# Patient Record
Sex: Female | Born: 1990
Health system: Southern US, Community
[De-identification: ages and names within clinical notes are randomized; demographics above are authoritative.]

## PROBLEM LIST (undated history)

## (undated) ENCOUNTER — Inpatient Hospital Stay (HOSPITAL_COMMUNITY): Payer: BC Managed Care – PPO

## (undated) ENCOUNTER — Inpatient Hospital Stay (HOSPITAL_COMMUNITY): Payer: Self-pay

## (undated) DIAGNOSIS — N63 Unspecified lump in unspecified breast: Secondary | ICD-10-CM

## (undated) DIAGNOSIS — N390 Urinary tract infection, site not specified: Secondary | ICD-10-CM

## (undated) DIAGNOSIS — F419 Anxiety disorder, unspecified: Secondary | ICD-10-CM

## (undated) DIAGNOSIS — O2303 Infections of kidney in pregnancy, third trimester: Secondary | ICD-10-CM

## (undated) DIAGNOSIS — Z9889 Other specified postprocedural states: Secondary | ICD-10-CM

## (undated) DIAGNOSIS — F32A Depression, unspecified: Secondary | ICD-10-CM

## (undated) DIAGNOSIS — F329 Major depressive disorder, single episode, unspecified: Secondary | ICD-10-CM

## (undated) DIAGNOSIS — O99019 Anemia complicating pregnancy, unspecified trimester: Secondary | ICD-10-CM

## (undated) DIAGNOSIS — N83209 Unspecified ovarian cyst, unspecified side: Secondary | ICD-10-CM

## (undated) DIAGNOSIS — R112 Nausea with vomiting, unspecified: Secondary | ICD-10-CM

## (undated) HISTORY — PX: PILONIDAL CYST EXCISION: SHX744

## (undated) HISTORY — DX: Anxiety disorder, unspecified: F41.9

## (undated) HISTORY — DX: Anemia complicating pregnancy, unspecified trimester: O99.019

## (undated) HISTORY — DX: Unspecified ovarian cyst, unspecified side: N83.209

## (undated) HISTORY — DX: Infections of kidney in pregnancy, third trimester: O23.03

## (undated) HISTORY — PX: WISDOM TOOTH EXTRACTION: SHX21

## (undated) HISTORY — DX: Unspecified lump in unspecified breast: N63.0

## (undated) HISTORY — DX: Depression, unspecified: F32.A

## (undated) HISTORY — PX: DILATION AND CURETTAGE OF UTERUS: SHX78

## (undated) HISTORY — PX: BREAST SURGERY: SHX581

## (undated) HISTORY — DX: Major depressive disorder, single episode, unspecified: F32.9

## (undated) HISTORY — PX: HERNIA REPAIR: SHX51

---
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
## 2009-06-03 ENCOUNTER — Emergency Department (HOSPITAL_COMMUNITY): Admission: EM | Admit: 2009-06-03 | Discharge: 2009-06-03 | Payer: Self-pay | Admitting: Emergency Medicine

## 2010-05-30 ENCOUNTER — Emergency Department (HOSPITAL_COMMUNITY)
Admission: EM | Admit: 2010-05-30 | Discharge: 2010-05-31 | Payer: Self-pay | Source: Home / Self Care | Admitting: Emergency Medicine

## 2010-06-25 ENCOUNTER — Emergency Department (HOSPITAL_COMMUNITY)
Admission: EM | Admit: 2010-06-25 | Discharge: 2010-06-25 | Payer: Self-pay | Source: Home / Self Care | Admitting: Emergency Medicine

## 2010-07-14 NOTE — L&D Delivery Note (Signed)
SVD VIABLE FEMALE APGAR 8/9 NUCHAL CORD X 1 NO EPIS  FIRST DEGREE MIDLINE TEAR WITH REPAIR EPIDURAL AND LOCAL PLACENTA INTACT WITH 3 VESSEL CORD

## 2010-09-23 LAB — DIFFERENTIAL
Basophils Relative: 1 % (ref 0–1)
Eosinophils Absolute: 0.1 10*3/uL (ref 0.0–0.7)
Eosinophils Relative: 1 % (ref 0–5)
Lymphs Abs: 1.4 10*3/uL (ref 0.7–4.0)
Monocytes Absolute: 0.5 10*3/uL (ref 0.1–1.0)
Monocytes Relative: 10 % (ref 3–12)
Neutrophils Relative %: 59 % (ref 43–77)

## 2010-09-23 LAB — URINALYSIS, ROUTINE W REFLEX MICROSCOPIC
Glucose, UA: NEGATIVE mg/dL
Hgb urine dipstick: NEGATIVE
Specific Gravity, Urine: 1.025 (ref 1.005–1.030)

## 2010-09-23 LAB — CBC
HCT: 36 % (ref 36.0–46.0)
Hemoglobin: 11.8 g/dL — ABNORMAL LOW (ref 12.0–15.0)
MCHC: 32.8 g/dL (ref 30.0–36.0)
RDW: 12.8 % (ref 11.5–15.5)
WBC: 4.7 10*3/uL (ref 4.0–10.5)

## 2010-09-23 LAB — COMPREHENSIVE METABOLIC PANEL
ALT: 20 U/L (ref 0–35)
AST: 14 U/L (ref 0–37)
Albumin: 4.2 g/dL (ref 3.5–5.2)
Alkaline Phosphatase: 52 U/L (ref 39–117)
Calcium: 9.3 mg/dL (ref 8.4–10.5)
GFR calc Af Amer: >60 mL/min (ref >60)
Glucose, Bld: 96 mg/dL (ref 70–99)
Potassium: 3.5 mEq/L (ref 3.5–5.1)
Sodium: 142 mEq/L (ref 135–145)
Total Protein: 7.1 g/dL (ref 6.0–8.3)

## 2010-09-23 LAB — URINE MICROSCOPIC-ADD ON

## 2010-09-23 LAB — URINE CULTURE: Culture  Setup Time: 201112131942

## 2010-09-24 LAB — COMPREHENSIVE METABOLIC PANEL
ALT: 13 U/L (ref 0–35)
AST: 15 U/L (ref 0–37)
CO2: 26 mEq/L (ref 19–32)
Chloride: 108 mEq/L (ref 96–112)
Creatinine, Ser: 0.87 mg/dL (ref 0.4–1.2)
GFR calc Af Amer: >60 mL/min (ref >60)
GFR calc non Af Amer: >60 mL/min (ref >60)
Glucose, Bld: 93 mg/dL (ref 70–99)
Sodium: 142 mEq/L (ref 135–145)
Total Bilirubin: 0.2 mg/dL — ABNORMAL LOW (ref 0.3–1.2)

## 2010-09-24 LAB — URINALYSIS, ROUTINE W REFLEX MICROSCOPIC
Glucose, UA: NEGATIVE mg/dL
Ketones, ur: 15 mg/dL — AB
Protein, ur: 30 mg/dL — AB
pH: 6 (ref 5.0–8.0)

## 2010-09-24 LAB — URINE MICROSCOPIC-ADD ON

## 2010-09-24 LAB — URINE CULTURE: Culture  Setup Time: 201111180130

## 2010-09-24 LAB — CBC
HCT: 35.4 % — ABNORMAL LOW (ref 36.0–46.0)
Hemoglobin: 11.7 g/dL — ABNORMAL LOW (ref 12.0–15.0)
MCH: 29.8 pg (ref 26.0–34.0)
MCHC: 33.1 g/dL (ref 30.0–36.0)
RBC: 3.93 MIL/uL (ref 3.87–5.11)

## 2010-09-24 LAB — DIFFERENTIAL
Basophils Absolute: 0 10*3/uL (ref 0.0–0.1)
Basophils Relative: 0 % (ref 0–1)
Eosinophils Absolute: 0.1 10*3/uL (ref 0.0–0.7)
Eosinophils Relative: 1 % (ref 0–5)
Neutrophils Relative %: 61 % (ref 43–77)

## 2010-09-24 LAB — WET PREP, GENITAL

## 2010-09-24 LAB — GC/CHLAMYDIA PROBE AMP, GENITAL: GC Probe Amp, Genital: NEGATIVE

## 2010-09-24 LAB — LIPASE, BLOOD: Lipase: 33 U/L (ref 11–59)

## 2010-10-16 LAB — URINE MICROSCOPIC-ADD ON

## 2010-10-16 LAB — URINALYSIS, ROUTINE W REFLEX MICROSCOPIC
Bilirubin Urine: NEGATIVE
Glucose, UA: NEGATIVE mg/dL
Ketones, ur: 15 mg/dL — AB
Nitrite: POSITIVE — AB
Protein, ur: >300 mg/dL — AB
Specific Gravity, Urine: 1.028 (ref 1.005–1.030)
Urobilinogen, UA: 1 mg/dL (ref 0.0–1.0)
pH: 6.5 (ref 5.0–8.0)

## 2010-10-16 LAB — URINE CULTURE: Colony Count: >=100000

## 2010-10-16 LAB — POCT PREGNANCY, URINE: Preg Test, Ur: NEGATIVE

## 2010-11-07 LAB — ABO/RH: RH Type: POSITIVE

## 2010-11-07 LAB — GC/CHLAMYDIA PROBE AMP, GENITAL
Chlamydia: NEGATIVE
Gonorrhea: NEGATIVE

## 2010-11-07 LAB — RUBELLA ANTIBODY, IGM: Rubella: IMMUNE

## 2010-11-15 DIAGNOSIS — O368190 Decreased fetal movements, unspecified trimester, not applicable or unspecified: Secondary | ICD-10-CM

## 2010-11-29 NOTE — H&P (Signed)
NAMEDEVANI, ODONNEL             ACCOUNT NO.:  [id number]   MEDICAL RECORD NO.:  [id number]          PATIENT TYPE:  INP   LOCATION:  6125                         FACILITY:  [facility]   PHYSICIAN:  Corwin Levins, M.D. LHCDATE OF BIRTH:  02-Feb-1991   DATE OF ADMISSION:  08/02/2004  DATE OF DISCHARGE:                                HISTORY & PHYSICAL   CHIEF COMPLAINT:  Here with two days worsening right-sided abdominal pain.   HISTORY OF PRESENT ILLNESS:  Mr. Tara Gallagher is a 20 year old white female here  for the above. She has had some discomfort on the right side, seemingly  right upper quadrant, but now looks like more towards right lower quadrant.  She thinks it is associated with nausea and anorexia. No vomiting. She has  had some constipation recently, but no other significant problems.   PAST MEDICAL HISTORY:   ILLNESS:  1.  Mild Tourette's.  2.  Status post pilonidal cyst.   ALLERGIES:  None.   CURRENT MEDICATIONS:  None.   SOCIAL HISTORY:  No tobacco or alcohol. Single. In the 8th grade.   FAMILY HISTORY:  Negative per patient and mother who is with her.   PHYSICAL EXAMINATION:  GENERAL: Ms. Tara Gallagher is a 20 year old white female.  VITAL SIGNS: Blood pressure 100/60, respirations 20, pulse 64, temperature  98, weight 118.  HEENT: Sclerae clear. TMs clear. Oropharynx benign.  NECK: Without lymphadenopathy, JVD, hepatomegaly.  CHEST: No rales or wheezing.  CARDIAC: Regular rate and rhythm. No murmur.  ABDOMEN: Soft without guarding or rebound, but at least moderate right-sided  pain, right upper and right lower quadrant.  EXTREMITIES: No edema.   IMPRESSION:  Abdominal pain suspicious for evolving appendicitis-like  discomfort in this young female. Will admit. Check routine labs, abdominal  and pelvis CT, cultures, give p.r.n. medications, Phenergan, and IV fluids.  Consider pediatric surgeon consult.       JWJ/MEDQ  D:  08/02/2004  T:  08/02/2004  Job:  [id number]

## 2010-11-29 NOTE — Discharge Summary (Signed)
NAMESHENE, MAXFIELD             ACCOUNT NO.:  [id number]   MEDICAL RECORD NO.:  [id number]          PATIENT TYPE:  INP   LOCATION:  6125                         FACILITY:  [facility]   PHYSICIAN:  Georgina Quint. Plotnikov, M.D. Romualdo Bolk OF BIRTH:  May 11, 1991   DATE OF ADMISSION:  08/02/2004  DATE OF DISCHARGE:  08/03/2004                                 DISCHARGE SUMMARY   DISCHARGE DIAGNOSES:  1.  Abdominal pain due to problem #2, resolved.  2.  Constipation/stool impaction, resolved.   TESTS:  CT scan negative for acute infectious process.   DISCHARGE MEDICATIONS:  1.  Fibercon tablets 2 to 6 daily to keep bowel movements regular.  2.  Crystallose 20 gm packets one-half - 1 p.r.n. constipation.   DIET:  High fiber.   SPECIAL INSTRUCTIONS:  Call if problems.   HISTORY:  The patient is a 20 year old female, who presented to Dr. Raphael Gibney  office with the complaint of abdominal pain.  She was admitted to [facility]  to rule out appendicitis.  Underwent a CAT scan, which by preliminary  reports, showed a large amount of stool in the rectum and colon and negative  for appendicitis.  She received a suppository, enema, and GoLYTELY.  Had two  large stools with good resolution of her symptoms.   DISCHARGE PHYSICAL EXAM:  GENERAL:  On the day of discharge, she is feeling  well.  VITAL SIGNS:  She is afebrile with a temperature of 36.8, heart rate 76,  respirations 18,  blood pressure 126/78, and SATs 98% on room air.  HEENT:  Moist mucosa.  LUNGS:  Clear.  HEART:  Regular S1, S2.  ABDOMEN:  Soft and nontender.  No organomegaly is felt.  NEUROLOGIC:  She is alert, oriented, and cooperative.   LABORATORY DATA:  CAT scan negative except for obstipation as above.  On  January 20th, blood count is normal with WBC of 6.0, hemoglobin 12.9 C-MET  is normal.     AVP/MEDQ  D:  08/03/2004  T:  08/03/2004  Job:  [id number]   cc:   Corwin Levins, M.D. [facility]

## 2010-11-29 NOTE — Op Note (Signed)
NAMEJANISSE, Tara Gallagher                       ACCOUNT NO.:  [id number]   MEDICAL RECORD NO.:  [id number]                   PATIENT TYPE:  AMB   LOCATION:  DSC                                  FACILITY:  [facility]   PHYSICIAN:  Jimmye Norman III, M.D.               DATE OF BIRTH:  10/27/1990   DATE OF PROCEDURE:  08/17/2003  DATE OF DISCHARGE:                                 OPERATIVE REPORT   PREOPERATIVE DIAGNOSIS:  Active pilonidal disease.   POSTOPERATIVE DIAGNOSIS:  Active pilonidal disease.   PROCEDURE:  Excision and closure of pilonidal diseased area.   SURGEON:  Jimmye Norman, M.D.   ASSISTANT:  None.   POSITION:  Jackknife prone.   ANESTHESIA:  General endotracheal.   ESTIMATED BLOOD LOSS:  Less than 20 mL.   COMPLICATIONS:  None.   CONDITION:  Good.   26 mL of local anesthetic was administered of 0.25% Marcaine with  epinephrine.   DISPOSITION:  To PACU and then to home.   SPECIMENS:  Excised pilonidal cystic diseased area.   FINDINGS:  The patient had a 2 to 3 mm opening with a deep pocket extending  approximately 4 cm toward the rectum, but not into the rectum itself.  Proximally, it did not extend far, but we went up to the most proximal site  of infection.   DESCRIPTION OF PROCEDURE:  The patient was taken to the operating room and  placed on the table in the supine position.  After an adequate endotracheal  anesthetic was administered, she was flipped into the jackknife prone  position and prepped and draped in the usual sterile fashion.   We used a probe to probe the sinus down toward the rectum where it extended  4 to 5 cm.  We used that in place as we marked the sites of our incision  using marking pen.  We then used a #10 blade to go through the skin into the  subcutaneous tissue down to the lowest point where the probe extended.  We  then used electrocautery to excise the subcu and tissue down to the fascia  of the coccyx and sacrum.  We obtained  hemostasis using electrocautery and  once we had adequate hemostasis, we removed completely the sinus and  pilonidal diseased area including areas at the furthest end of the probe.  There was no exposed bone or cartilage.  We then closed in three layers  after obtaining hemostasis and injecting with 0.25% Marcaine with  epinephrine.  The deepest layer was a 2-0 Vicryl layer and the deepest  subcutaneous tissue and the more superficial subcu tissue was reapproximated  using 3-0 Vicryl.  The skin was closed using interrupted vertical mattress  sutures of 3-0 nylon.  This reapproximated the skin very well and then a  sterile dressing was applied.  All needle, sponge, and instrument counts  correct at the conclusion of the case.  Kathrin Ruddy, M.D.    JW/MEDQ  D:  08/17/2003  T:  08/17/2003  Job:  [id number]

## 2011-03-07 ENCOUNTER — Inpatient Hospital Stay (HOSPITAL_COMMUNITY)
Admission: AD | Admit: 2011-03-07 | Discharge: 2011-03-07 | Disposition: A | Discharge status: Home / Self Care | Payer: BC Managed Care – PPO | Source: Ambulatory Visit | Attending: Obstetrics and Gynecology | Admitting: Obstetrics and Gynecology

## 2011-03-07 ENCOUNTER — Encounter (HOSPITAL_COMMUNITY): Payer: Self-pay | Admitting: *Deleted

## 2011-03-07 DIAGNOSIS — O368190 Decreased fetal movements, unspecified trimester, not applicable or unspecified: Secondary | ICD-10-CM | POA: Diagnosis present

## 2011-03-07 DIAGNOSIS — Z3689 Encounter for other specified antenatal screening: Secondary | ICD-10-CM

## 2011-03-07 DIAGNOSIS — Z3490 Encounter for supervision of normal pregnancy, unspecified, unspecified trimester: Secondary | ICD-10-CM

## 2011-03-07 NOTE — ED Provider Notes (Signed)
History     Chief Complaint  Patient presents with  . Decreased Fetal Movement   HPI No fetal movement x 2 days. Feeling fetal movement now. No bleeding, pain, leaking fluid, contractions. Uncomplicated prenatal course.   OB History    Grav Para Term Preterm Abortions TAB SAB Ect Mult Living   1               Past Medical History  Diagnosis Date  . No pertinent past medical history     Past Surgical History  Procedure Date  . No past surgeries     No family history on file.  History  Substance Use Topics  . Smoking status: Never Smoker   . Smokeless tobacco: Not on file  . Alcohol Use: No    Allergies: No Known Allergies  Prescriptions prior to admission  Medication Sig Dispense Refill  . acetaminophen (TYLENOL) 500 MG tablet Take 1,000 mg by mouth every 6 (six) hours as needed.        . prenatal vitamin w/FE, FA (PRENATAL 1 + 1) 27-1 MG TABS Take 1 tablet by mouth daily.          Review of Systems  Constitutional: Negative.   Respiratory: Negative.   Cardiovascular: Negative.   Gastrointestinal: Negative for nausea, vomiting, abdominal pain, diarrhea and constipation.  Genitourinary: Negative for dysuria, urgency, frequency, hematuria and flank pain.       Negative for vaginal bleeding, cramping/contractions  Musculoskeletal: Negative.   Neurological: Negative.   Psychiatric/Behavioral: Negative.    Physical Exam   Blood pressure 126/67, pulse 93, temperature 97.6 F (36.4 C), temperature source Oral, resp. rate 20, height 5\' 3"  (1.6 m), weight 73.029 kg (161 lb), last menstrual period 09/11/2010, SpO2 99.00%.  Physical Exam  Constitutional: She is oriented to person, place, and time. She appears well-developed and well-nourished. No distress.  Cardiovascular: Normal rate.   Respiratory: Effort normal.  GI: Soft. There is no tenderness.  Musculoskeletal: Normal range of motion.  Neurological: She is alert and oriented to person, place, and time.    Skin: Skin is warm and dry.  Psychiatric: She has a normal mood and affect.   EFM: Baseline:140s Variability:mod Accels:present Decels:variable x 1  Toco:quiet  MAU Course  Procedures [place] ok to d/c home with reactive tracing  Assessment and Plan  20 y.o. G1P0 at [redacted]w[redacted]d Decreased fetal movement, reactive NST D/C home, Rev'd kick counts, f/u as scheduled  Roquel Burgin 03/07/2011, 9:32 PM

## 2011-03-07 NOTE — Progress Notes (Signed)
Pt states she has not felt the baby move in x2 days.

## 2011-04-04 ENCOUNTER — Inpatient Hospital Stay (HOSPITAL_COMMUNITY)
Admission: AD | Admit: 2011-04-04 | Discharge: 2011-04-04 | Disposition: A | Discharge status: Home / Self Care | Payer: BC Managed Care – PPO | Source: Ambulatory Visit | Attending: Obstetrics and Gynecology | Admitting: Obstetrics and Gynecology

## 2011-04-04 ENCOUNTER — Encounter (HOSPITAL_COMMUNITY): Payer: Self-pay | Admitting: *Deleted

## 2011-04-04 DIAGNOSIS — O368190 Decreased fetal movements, unspecified trimester, not applicable or unspecified: Secondary | ICD-10-CM | POA: Insufficient documentation

## 2011-04-04 DIAGNOSIS — O288 Other abnormal findings on antenatal screening of mother: Secondary | ICD-10-CM | POA: Diagnosis present

## 2011-04-04 NOTE — ED Provider Notes (Signed)
History     Chief Complaint  Patient presents with  . Decreased Fetal Movement   HPI No fetal movement all day. Few contractions, no bleeding or LOF. Baby moving now.   OB History    Grav Para Term Preterm Abortions TAB SAB Ect Mult Living   1               Past Medical History  Diagnosis Date  . No pertinent past medical history     Past Surgical History  Procedure Date  . Wisdom tooth extraction     No family history on file.  History  Substance Use Topics  . Smoking status: Never Smoker   . Smokeless tobacco: Not on file  . Alcohol Use: No    Allergies: No Known Allergies  Prescriptions prior to admission  Medication Sig Dispense Refill  . acetaminophen (TYLENOL) 500 MG tablet Take 1,000 mg by mouth every 6 (six) hours as needed. For pain      . IRON PO Take 1 tablet by mouth daily.        . prenatal vitamin w/FE, FA (PRENATAL 1 + 1) 27-1 MG TABS Take 1 tablet by mouth daily.          Review of Systems  Constitutional: Negative.   Respiratory: Negative.   Cardiovascular: Negative.   Gastrointestinal: Negative for nausea, vomiting, abdominal pain, diarrhea and constipation.  Genitourinary: Negative for dysuria, urgency, frequency, hematuria and flank pain.       Negative for vaginal bleeding  Musculoskeletal: Negative.   Neurological: Negative.   Psychiatric/Behavioral: Negative.    Physical Exam   Blood pressure 135/65, pulse 85, temperature 98.6 F (37 C), temperature source Oral, resp. rate 18, height 5\' 2"  (1.575 m), weight 82.192 kg (181 lb 3.2 oz), last menstrual period 09/11/2010.  Physical Exam  Constitutional: She is oriented to person, place, and time. She appears well-developed and well-nourished. No distress.  Cardiovascular: Normal rate.   Respiratory: Effort normal.  GI: Soft. There is no tenderness.  Musculoskeletal: Normal range of motion.  Neurological: She is alert and oriented to person, place, and time.  Skin: Skin is warm and  dry.  Psychiatric: She has a normal mood and affect.   EFM: Baseline:140 Variability:mod Accels:present Decels:absent  Toco:quiet  MAU Course  Procedures   Assessment and Plan  20 y.o. G1P0 [redacted]w[redacted]d with decreased fetal movement Reactive NST D/C home with precautions  FRAZIER,NATALIE 04/04/2011, 1:53 AM

## 2011-04-04 NOTE — Progress Notes (Signed)
Pt G1 at 29.3wks, no fetal movement today, having contractions since 0045.

## 2011-04-04 NOTE — Progress Notes (Signed)
Pt presents to mau for decreased fetal movement.  efm and toco placed. Assessing.

## 2011-04-04 NOTE — Progress Notes (Signed)
N. Frazier, CNM at bedside.  Assessment done and poc discussed with pt.  

## 2011-05-20 ENCOUNTER — Inpatient Hospital Stay (HOSPITAL_COMMUNITY)
Admission: AD | Admit: 2011-05-20 | Discharge: 2011-05-20 | Disposition: A | Discharge status: Home / Self Care | Payer: BC Managed Care – PPO | Source: Ambulatory Visit | Attending: Obstetrics and Gynecology | Admitting: Obstetrics and Gynecology

## 2011-05-20 ENCOUNTER — Encounter (HOSPITAL_COMMUNITY): Payer: Self-pay

## 2011-05-20 DIAGNOSIS — F419 Anxiety disorder, unspecified: Secondary | ICD-10-CM

## 2011-05-20 DIAGNOSIS — O368190 Decreased fetal movements, unspecified trimester, not applicable or unspecified: Secondary | ICD-10-CM

## 2011-05-20 DIAGNOSIS — F411 Generalized anxiety disorder: Secondary | ICD-10-CM

## 2011-05-20 DIAGNOSIS — O4700 False labor before 37 completed weeks of gestation, unspecified trimester: Secondary | ICD-10-CM | POA: Insufficient documentation

## 2011-05-20 DIAGNOSIS — O479 False labor, unspecified: Secondary | ICD-10-CM

## 2011-05-20 NOTE — Progress Notes (Signed)
Pt states, " I've been having contractions since 5:45 and they are strong than usual. The baby isn't moving as much as today, maybe twice in an hour. I just hadn't felt good this week. My chest has been hurting since yesterday and it is worse today with a lot of pressure and I feel like I can't breathe."

## 2011-05-20 NOTE — ED Provider Notes (Signed)
History     CSN: [id number] Arrival date & time: 05/20/2011  8:02 PM   None     Chief Complaint  Patient presents with  . Contractions  . Decreased Fetal Movement  . Chest Pain     HPI Tara Gallagher is a 20 y.o. female @ [redacted]w[redacted]d gestation who presents to MAU for decreased fetal movement for the past 5 hours. Feeling pressure in lower abdomen, nausea.   Past Medical History  Diagnosis Date  . No pertinent past medical history     Past Surgical History  Procedure Date  . Wisdom tooth extraction   . Pilonidal cyst excision 2002    No family history on file.  History  Substance Use Topics  . Smoking status: Never Smoker   . Smokeless tobacco: Not on file  . Alcohol Use: No    OB History    Grav Para Term Preterm Abortions TAB SAB Ect Mult Living   1 0 0 0 0 0 0 0 0 0       Review of Systems  Constitutional: Negative for fever, chills, diaphoresis and fatigue.  HENT: Positive for ear pain and congestion. Negative for sore throat, facial swelling, neck pain, neck stiffness, dental problem and sinus pressure.   Eyes: Negative for photophobia, pain and discharge.  Respiratory: Positive for cough. Negative for chest tightness and wheezing.   Cardiovascular: Negative.   Gastrointestinal: Positive for nausea and abdominal pain. Negative for vomiting, diarrhea, constipation and abdominal distention.  Genitourinary: Positive for frequency, vaginal discharge and pelvic pain. Negative for dysuria, flank pain and difficulty urinating.  Musculoskeletal: Positive for back pain. Negative for myalgias and gait problem.  Skin: Negative for color change and rash.  Neurological: Positive for dizziness. Negative for speech difficulty, weakness, numbness and headaches.  Psychiatric/Behavioral: Negative for confusion and agitation.    Allergies  Review of patient's allergies indicates no known allergies.  Home Medications  No current outpatient prescriptions on file.  BP 123/62   Pulse 91  Temp(Src) 99.1 F (37.3 C) (Oral)  Resp 20  Ht 5' 2.5" (1.588 m)  Wt 197 lb 6 oz (89.529 kg)  BMI 35.53 kg/m2  SpO2 97%  LMP 09/11/2010  Physical Exam  Nursing note and vitals reviewed. Constitutional: She is oriented to person, place, and time. She appears well-developed and well-nourished. No distress.  HENT:  Head: Normocephalic.  Eyes: EOM are normal.  Neck: Neck supple.  Cardiovascular: Normal rate.   Pulmonary/Chest: Effort normal.  Abdominal: Soft. There is no tenderness.  Genitourinary:       Cervix 2 cm, 30% and ballotable. Base line FH 145, reactive tracing. Irregular contractions.  Musculoskeletal: Normal range of motion.  Neurological: She is alert and oriented to person, place, and time. No cranial nerve deficit.  Skin: Skin is warm and dry.  Psychiatric: Her behavior is normal. Judgment and thought content normal. Her mood appears anxious.   Assessment: False Labor   Anxiety   Decreased fetal movement per patient Plan:     Kick counts   Increase fluids   Follow up in the office  ED Course: consult with Dr. Arelia Sneddon request that we d/c patient home to follow up in the office.   Procedures    MDM          Kerrie Buffalo, NP 05/20/11 2207

## 2011-05-23 ENCOUNTER — Encounter (HOSPITAL_COMMUNITY): Payer: Self-pay | Admitting: *Deleted

## 2011-05-23 ENCOUNTER — Inpatient Hospital Stay (HOSPITAL_COMMUNITY)
Admission: AD | Admit: 2011-05-23 | Discharge: 2011-05-23 | Disposition: A | Discharge status: Home / Self Care | Payer: BC Managed Care – PPO | Source: Ambulatory Visit | Attending: Obstetrics and Gynecology | Admitting: Obstetrics and Gynecology

## 2011-05-23 DIAGNOSIS — O4700 False labor before 37 completed weeks of gestation, unspecified trimester: Secondary | ICD-10-CM | POA: Insufficient documentation

## 2011-05-23 NOTE — Progress Notes (Signed)
SAYS HURT BAD SINCE 0300-  WENT TO DR TODAY- VE-  2 CM    DENIES HSV AND MRSA.

## 2011-06-03 ENCOUNTER — Inpatient Hospital Stay (HOSPITAL_COMMUNITY)
Admission: AD | Admit: 2011-06-03 | Discharge: 2011-06-03 | Disposition: A | Discharge status: Home / Self Care | Payer: BC Managed Care – PPO | Source: Ambulatory Visit | Attending: Obstetrics and Gynecology | Admitting: Obstetrics and Gynecology

## 2011-06-03 ENCOUNTER — Encounter (HOSPITAL_COMMUNITY): Payer: Self-pay | Admitting: *Deleted

## 2011-06-03 DIAGNOSIS — O4700 False labor before 37 completed weeks of gestation, unspecified trimester: Secondary | ICD-10-CM | POA: Insufficient documentation

## 2011-06-03 NOTE — Progress Notes (Signed)
Notified Dr. Rana Snare SVE 3-4 /100/-1. Effacement change from office. OK to let pt walk in hall x1 hr and recheck cervix.

## 2011-06-03 NOTE — Progress Notes (Signed)
Notified of no change inSVE. OK to d/c home 

## 2011-06-03 NOTE — Progress Notes (Signed)
Pt states, " I've had a low backache all day, and on the way here I began feeling them in my low abdomen

## 2011-06-04 ENCOUNTER — Inpatient Hospital Stay (HOSPITAL_COMMUNITY)
Admission: AD | Admit: 2011-06-04 | Discharge: 2011-06-04 | Disposition: A | Discharge status: Home / Self Care | Payer: BC Managed Care – PPO | Source: Ambulatory Visit | Attending: Obstetrics and Gynecology | Admitting: Obstetrics and Gynecology

## 2011-06-04 ENCOUNTER — Encounter (HOSPITAL_COMMUNITY): Payer: Self-pay | Admitting: *Deleted

## 2011-06-04 DIAGNOSIS — O479 False labor, unspecified: Secondary | ICD-10-CM | POA: Insufficient documentation

## 2011-06-04 NOTE — Progress Notes (Signed)
Pt seen in MAU last night was 3-4 cm's, pt states uc's continue, are more intense, denies bleeding or LOF.

## 2011-06-04 NOTE — Progress Notes (Signed)
MD notified of unchanged SVE, reactive FHR, uc's q 7 minutes... See DC order.

## 2011-06-06 ENCOUNTER — Encounter (HOSPITAL_COMMUNITY): Payer: Self-pay

## 2011-06-06 ENCOUNTER — Inpatient Hospital Stay (HOSPITAL_COMMUNITY)
Admission: AD | Admit: 2011-06-06 | Discharge: 2011-06-09 | DRG: 373 | Disposition: A | Discharge status: Home / Self Care | Payer: BC Managed Care – PPO | Source: Ambulatory Visit | Attending: Obstetrics and Gynecology | Admitting: Obstetrics and Gynecology

## 2011-06-06 DIAGNOSIS — Z3490 Encounter for supervision of normal pregnancy, unspecified, unspecified trimester: Secondary | ICD-10-CM

## 2011-06-06 NOTE — Progress Notes (Signed)
Pt states, " i''ve had contractions since 7:45 pm and they are every 3 min. I've been 4cm since last Thursday ."

## 2011-06-06 NOTE — Progress Notes (Signed)
Patient c/o pain during contractions, about 5 minutes apart and moderate quality on palpation. SVE was very uncomfortable for patient, baby is high and cervix posterior. Cervix dilated to 3-4 cms, very soft with inner and outer os, 50% effaced, no bloody show. Called Dr. Arelia Sneddon, report provided.  Dr. Arelia Sneddon wants patient to stay for awhile and then reassess. Patient can walk if she would like. Candise Che, RN

## 2011-06-06 NOTE — Progress Notes (Deleted)
Patient is here with c/o sharp right lower to mid abdominal pain that started about 2 hours ago. She also c/o intermittent shooting pain in her left groin that radiates to her legs. She denies any vaginal bleeding, discharge or lof., she reports good fetal movement.

## 2011-06-07 ENCOUNTER — Encounter (HOSPITAL_COMMUNITY): Payer: Self-pay | Admitting: Anesthesiology

## 2011-06-07 ENCOUNTER — Encounter (HOSPITAL_COMMUNITY): Payer: Self-pay | Admitting: *Deleted

## 2011-06-07 ENCOUNTER — Inpatient Hospital Stay (HOSPITAL_COMMUNITY): Payer: BC Managed Care – PPO | Admitting: Anesthesiology

## 2011-06-07 LAB — CBC
HCT: 31.8 % — ABNORMAL LOW (ref 36.0–46.0)
MCV: 91.9 fL (ref 78.0–100.0)
RBC: 3.46 MIL/uL — ABNORMAL LOW (ref 3.87–5.11)
WBC: 13.7 10*3/uL — ABNORMAL HIGH (ref 4.0–10.5)

## 2011-06-07 MED ORDER — OXYTOCIN 20 UNITS IN LACTATED RINGERS INFUSION - SIMPLE
1.0000 m[IU]/min | INTRAVENOUS | Status: DC
  Administered 2011-06-07: 2 m[IU]/min via INTRAVENOUS
  Administered 2011-06-07: 10 m[IU]/min via INTRAVENOUS
  Filled 2011-06-07: qty 1000

## 2011-06-07 MED ORDER — BISACODYL 10 MG RE SUPP: 10.0000 mg | Freq: Every day | RECTAL | Status: DC | PRN

## 2011-06-07 MED ORDER — OXYTOCIN 20 UNITS IN LACTATED RINGERS INFUSION - SIMPLE: 125.0000 mL/h | Freq: Once | INTRAVENOUS | Status: DC

## 2011-06-07 MED ORDER — FENTANYL 2.5 MCG/ML BUPIVACAINE 1/10 % EPIDURAL INFUSION (WH - ANES)
14.0000 mL/h | INTRAMUSCULAR | Status: DC
  Administered 2011-06-07: 14 mL/h via EPIDURAL
  Filled 2011-06-07 (×2): qty 60

## 2011-06-07 MED ORDER — EPHEDRINE 5 MG/ML INJ
10.0000 mg | INTRAVENOUS | Status: DC | PRN
  Filled 2011-06-07: qty 4

## 2011-06-07 MED ORDER — DIPHENHYDRAMINE HCL 50 MG/ML IJ SOLN: 12.5000 mg | INTRAMUSCULAR | Status: DC | PRN

## 2011-06-07 MED ORDER — LACTATED RINGERS IV SOLN
INTRAVENOUS | Status: DC
  Administered 2011-06-07: 10:00:00 via INTRAVENOUS

## 2011-06-07 MED ORDER — SENNOSIDES-DOCUSATE SODIUM 8.6-50 MG PO TABS: 2.0000 | ORAL_TABLET | Freq: Every day | ORAL | Status: DC

## 2011-06-07 MED ORDER — DIBUCAINE 1 % RE OINT: 1.0000 "application " | TOPICAL_OINTMENT | RECTAL | Status: DC | PRN

## 2011-06-07 MED ORDER — LACTATED RINGERS IV SOLN: 500.0000 mL | Freq: Once | INTRAVENOUS | Status: DC

## 2011-06-07 MED ORDER — FLEET ENEMA 7-19 GM/118ML RE ENEM: 1.0000 | ENEMA | RECTAL | Status: DC | PRN

## 2011-06-07 MED ORDER — TERBUTALINE SULFATE 1 MG/ML IJ SOLN: 0.2500 mg | Freq: Once | INTRAMUSCULAR | Status: DC | PRN

## 2011-06-07 MED ORDER — BUTORPHANOL TARTRATE 2 MG/ML IJ SOLN
1.0000 mg | INTRAMUSCULAR | Status: DC | PRN
  Administered 2011-06-07: 1 mg via INTRAVENOUS
  Filled 2011-06-07 (×2): qty 1

## 2011-06-07 MED ORDER — PRENATAL PLUS 27-1 MG PO TABS
1.0000 | ORAL_TABLET | Freq: Every day | ORAL | Status: DC
  Administered 2011-06-08 – 2011-06-09 (×2): 1 via ORAL
  Filled 2011-06-07 (×2): qty 1

## 2011-06-07 MED ORDER — EPHEDRINE 5 MG/ML INJ: 10.0000 mg | INTRAVENOUS | Status: DC | PRN

## 2011-06-07 MED ORDER — IBUPROFEN 600 MG PO TABS
600.0000 mg | ORAL_TABLET | Freq: Four times a day (QID) | ORAL | Status: DC
  Administered 2011-06-07 – 2011-06-09 (×7): 600 mg via ORAL
  Filled 2011-06-07 (×7): qty 1

## 2011-06-07 MED ORDER — ONDANSETRON HCL 4 MG PO TABS: 4.0000 mg | ORAL_TABLET | ORAL | Status: DC | PRN

## 2011-06-07 MED ORDER — LACTATED RINGERS IV SOLN
500.0000 mL | INTRAVENOUS | Status: DC | PRN
  Administered 2011-06-07 (×2): 1000 mL via INTRAVENOUS

## 2011-06-07 MED ORDER — TETANUS-DIPHTH-ACELL PERTUSSIS 5-2.5-18.5 LF-MCG/0.5 IM SUSP: 0.5000 mL | Freq: Once | INTRAMUSCULAR | Status: DC

## 2011-06-07 MED ORDER — OXYCODONE-ACETAMINOPHEN 5-325 MG PO TABS: 2.0000 | ORAL_TABLET | ORAL | Status: DC | PRN

## 2011-06-07 MED ORDER — PHENYLEPHRINE 40 MCG/ML (10ML) SYRINGE FOR IV PUSH (FOR BLOOD PRESSURE SUPPORT): 80.0000 ug | PREFILLED_SYRINGE | INTRAVENOUS | Status: DC | PRN

## 2011-06-07 MED ORDER — LANOLIN HYDROUS EX OINT: TOPICAL_OINTMENT | CUTANEOUS | Status: DC | PRN

## 2011-06-07 MED ORDER — WITCH HAZEL-GLYCERIN EX PADS: 1.0000 "application " | MEDICATED_PAD | CUTANEOUS | Status: DC | PRN

## 2011-06-07 MED ORDER — OXYTOCIN BOLUS FROM INFUSION
500.0000 mL | Freq: Once | INTRAVENOUS | Status: AC
  Administered 2011-06-07: 500 mL via INTRAVENOUS
  Filled 2011-06-07: qty 500

## 2011-06-07 MED ORDER — CITRIC ACID-SODIUM CITRATE 334-500 MG/5ML PO SOLN: 30.0000 mL | ORAL | Status: DC | PRN

## 2011-06-07 MED ORDER — BENZOCAINE-MENTHOL 20-0.5 % EX AERO: 1.0000 "application " | INHALATION_SPRAY | CUTANEOUS | Status: DC | PRN

## 2011-06-07 MED ORDER — FENTANYL 2.5 MCG/ML BUPIVACAINE 1/10 % EPIDURAL INFUSION (WH - ANES)
INTRAMUSCULAR | Status: DC | PRN
  Administered 2011-06-07: 14 mL/h via EPIDURAL

## 2011-06-07 MED ORDER — BUTORPHANOL TARTRATE 2 MG/ML IJ SOLN
1.0000 mg | Freq: Once | INTRAMUSCULAR | Status: AC
  Administered 2011-06-07: 1 mg via INTRAVENOUS

## 2011-06-07 MED ORDER — ONDANSETRON HCL 4 MG/2ML IJ SOLN: 4.0000 mg | INTRAMUSCULAR | Status: DC | PRN

## 2011-06-07 MED ORDER — ONDANSETRON HCL 4 MG/2ML IJ SOLN
4.0000 mg | Freq: Four times a day (QID) | INTRAMUSCULAR | Status: DC | PRN
  Administered 2011-06-07: 4 mg via INTRAVENOUS
  Filled 2011-06-07: qty 2

## 2011-06-07 MED ORDER — ACETAMINOPHEN 325 MG PO TABS: 650.0000 mg | ORAL_TABLET | ORAL | Status: DC | PRN

## 2011-06-07 MED ORDER — PHENYLEPHRINE 40 MCG/ML (10ML) SYRINGE FOR IV PUSH (FOR BLOOD PRESSURE SUPPORT)
80.0000 ug | PREFILLED_SYRINGE | INTRAVENOUS | Status: DC | PRN
  Filled 2011-06-07: qty 5

## 2011-06-07 MED ORDER — SIMETHICONE 80 MG PO CHEW: 80.0000 mg | CHEWABLE_TABLET | ORAL | Status: DC | PRN

## 2011-06-07 MED ORDER — IBUPROFEN 600 MG PO TABS: 600.0000 mg | ORAL_TABLET | Freq: Four times a day (QID) | ORAL | Status: DC | PRN

## 2011-06-07 MED ORDER — ZOLPIDEM TARTRATE 5 MG PO TABS: 5.0000 mg | ORAL_TABLET | Freq: Every evening | ORAL | Status: DC | PRN

## 2011-06-07 MED ORDER — LIDOCAINE HCL (PF) 1 % IJ SOLN
30.0000 mL | INTRAMUSCULAR | Status: DC | PRN
  Filled 2011-06-07 (×2): qty 30

## 2011-06-07 MED ORDER — DIPHENHYDRAMINE HCL 25 MG PO CAPS: 25.0000 mg | ORAL_CAPSULE | Freq: Four times a day (QID) | ORAL | Status: DC | PRN

## 2011-06-07 MED ORDER — FLEET ENEMA 7-19 GM/118ML RE ENEM
1.0000 | ENEMA | RECTAL | Status: DC | PRN
Start: 2011-06-07 — End: 2011-06-09

## 2011-06-07 MED ORDER — BENZOCAINE-MENTHOL 20-0.5 % EX AERO
INHALATION_SPRAY | CUTANEOUS | Status: AC
  Filled 2011-06-07: qty 56

## 2011-06-07 MED ORDER — OXYCODONE-ACETAMINOPHEN 5-325 MG PO TABS
1.0000 | ORAL_TABLET | ORAL | Status: DC | PRN
  Administered 2011-06-08: 1 via ORAL
  Filled 2011-06-07: qty 1

## 2011-06-07 MED ORDER — SODIUM BICARBONATE 8.4 % IV SOLN
INTRAVENOUS | Status: DC | PRN
  Administered 2011-06-07: 5 mL via EPIDURAL

## 2011-06-07 NOTE — Progress Notes (Signed)
68- spontaneous del of viable female infant. apgars 9/9.

## 2011-06-07 NOTE — Progress Notes (Signed)
1610- pt out to walk for 1 hour. Then dr. Arelia Sneddon will recheck her cervix.

## 2011-06-07 NOTE — Progress Notes (Signed)
Called Dr. Arelia Sneddon, orders to admit patient to [facility].

## 2011-06-07 NOTE — Anesthesia Preprocedure Evaluation (Signed)
Anesthesia Evaluation    Airway       Dental   Pulmonary          Cardiovascular     Neuro/Psych    GI/Hepatic   Endo/Other  Morbid obesity  Renal/GU      Musculoskeletal   Abdominal   Peds  Hematology   Anesthesia Other Findings   Reproductive/Obstetrics                           Anesthesia Physical Anesthesia Plan  ASA: III  Anesthesia Plan: Epidural   Post-op Pain Management:    Induction:   Airway Management Planned:   Additional Equipment:   Intra-op Plan:   Post-operative Plan:   Informed Consent: I have reviewed the patients History and Physical, chart, labs and discussed the procedure including the risks, benefits and alternatives for the proposed anesthesia with the patient or authorized representative who has indicated his/her understanding and acceptance.   Dental Advisory Given  Plan Discussed with:   Anesthesia Plan Comments: (Labs checked- platelets confirmed with RN in room. Fetal heart tracing, per RN, reported to be stable enough for sitting procedure. Discussed epidural, and patient consents to the procedure:  included risk of possible headache,backache, failed block, allergic reaction, and nerve injury. This patient was asked if she had any questions or concerns before the procedure started.)        Anesthesia Quick Evaluation  

## 2011-06-07 NOTE — H&P (Signed)
Tara Gallagher is a 20 y.o. female presenting at 70.3 with SOL.  Noted cervical changes. AROM clear. GBS neg. Maternal Medical History:  Reason for admission: Reason for admission: contractions.  Contractions: Frequency: irregular.   Perceived severity is moderate.    Fetal activity: Perceived fetal activity is normal.    Prenatal Complications - Diabetes: none.    OB History    Grav Para Term Preterm Abortions TAB SAB Ect Mult Living   1 0 0 0 0 0 0 0 0 0      Past Medical History  Diagnosis Date  . No pertinent past medical history    Past Surgical History  Procedure Date  . Wisdom tooth extraction   . Pilonidal cyst excision 2002  . No past surgeries    Family History: family history is not on file. Social History:  reports that she has never smoked. She does not have any smokeless tobacco history on file. She reports that she does not drink alcohol or use illicit drugs.  ROS  Dilation: 4.5 Effacement (%): 60 Station: -1 Exam by:: dr. Arelia Sneddon Blood pressure 115/68, pulse 87, temperature 98.1 F (36.7 C), temperature source Oral, resp. rate 18, height 5\' 2"  (1.575 m), weight 95.029 kg (209 lb 8 oz), last menstrual period 09/11/2010. Maternal Exam:  Uterine Assessment: Contraction strength is moderate.  Contraction frequency is irregular.   Abdomen: Patient reports no abdominal tenderness. Fundal height is c/w dates.   Estimated fetal weight is 7.   Fetal presentation: vertex  Introitus: Amniotic fluid character: clear.  Pelvis: adequate for delivery.   Cervix: Cervix evaluated by digital exam.     Physical Exam  Prenatal labs: ABO, Rh: A/Positive/-- (04/26 0000) Antibody: Negative (04/26 0000) Rubella: Immune (04/26 0000) RPR: Nonreactive (04/26 0000)  HBsAg: Negative (04/26 0000)  HIV: Non-reactive (04/26 0000)  GBS: Negative (10/31 0000)   Assessment/Plan: IUP at 38.3 with SOL.  Risk of pitocin discussed   Tara Gallagher S 06/07/2011, 9:00  AM

## 2011-06-07 NOTE — Anesthesia Procedure Notes (Signed)
Epidural Patient location during procedure: OB  Preanesthetic Checklist Completed: patient identified, site marked, surgical consent, pre-op evaluation, timeout performed, IV checked, risks and benefits discussed and monitors and equipment checked  Epidural Patient position: sitting Prep: site prepped and draped and DuraPrep Patient monitoring: continuous pulse ox and blood pressure Approach: midline Injection technique: LOR air  Needle:  Needle type: Tuohy  Needle gauge: 17 G Needle length: 9 cm Needle insertion depth: 6 cm Catheter type: closed end flexible Catheter size: 19 Gauge Catheter at skin depth: 12 cm Test dose: negative  Assessment Events: blood not aspirated, injection not painful, no injection resistance, negative IV test and no paresthesia  Additional Notes Dosing of Epidural:  1st dose, through needle ............................................. epi 1:200K + Xylocaine 40 mg  2nd dose, through catheter, after waiting 3 minutes.....epi 1:200K + Xylocaine 60 mg  3rd dose, through catheter after waiting 3 minutes .............................Marcaine   5mg   ( mg Marcaine are expressed as equivilent  cc's medication removed from the 0.1%Bupiv / fentanyl syringe from L&D pump)  ( 2% Xylo charted as a single dose in Epic Meds for ease of charting; actual dosing was fractionated as above, for saftey's sake)  As each dose occurred, patient was free of IV sx; and patient exhibited no evidence of SA injection.  Patient is more comfortable after epidural dosed. Please see RN's note for documentation of vital signs,and FHR which are stable.       

## 2011-06-07 NOTE — Progress Notes (Signed)
Discussed possibility of patient going home and returning with stronger contractions are 5 min apart. Patient and husband are reluctant to leave, they live 40 minutes away and are fearful. Patient is very uncomfortable with contraction pain and wants to stay. Will observe for a little longer and recheck for cervical change.

## 2011-06-07 NOTE — Progress Notes (Signed)
Initial admission charted by Franz Dell RN in error of other RN being logged into computer.

## 2011-06-08 LAB — CBC
Hemoglobin: 9.3 g/dL — ABNORMAL LOW (ref 12.0–15.0)
RBC: 3.06 MIL/uL — ABNORMAL LOW (ref 3.87–5.11)

## 2011-06-08 NOTE — Anesthesia Postprocedure Evaluation (Signed)
Anesthesia Post Note  Patient: Tara Gallagher  Procedure(s) Performed: * No procedures listed *  Anesthesia type: Epidural  Patient location: Mother/Baby  Post pain: Pain level controlled  Post assessment: Post-op Vital signs reviewed  Last Vitals:  Filed Vitals:   06/08/11 0525  BP: 128/77  Pulse: 91  Temp: 36.8 C  Resp: 18    Post vital signs: Reviewed  Level of consciousness: awake  Complications: No apparent anesthesia complications

## 2011-06-08 NOTE — Progress Notes (Signed)
Post Partum Day one Subjective: no complaints  Objective: Blood pressure 128/77, pulse 91, temperature 98.3 F (36.8 C), temperature source Oral, resp. rate 18, height 5\' 2"  (1.575 m), weight 95.029 kg (209 lb 8 oz), last menstrual period 09/11/2010, unknown if currently breastfeeding.  Physical Exam:  General: alert Lochia: appropriate Uterine Fundus: firm Incision: no inc DVT Evaluation: No evidence of DVT seen on physical exam.   Basename 06/08/11 0537 06/07/11 0340  HGB 9.3* 10.4*  HCT 27.8* 31.8*    Assessment/Plan: Plan for discharge tomorrow   LOS: 2 days   Azai Gaffin S 06/08/2011, 8:06 AM

## 2011-06-09 NOTE — Progress Notes (Signed)
Post Partum Day 2 Subjective: no complaints  Objective: Blood pressure 116/72, pulse 81, temperature 98.4 F (36.9 C), temperature source Oral, resp. rate 18, height 5\' 2"  (1.575 m), weight 95.029 kg (209 lb 8 oz), last menstrual period 09/11/2010, SpO2 95.00%, unknown if currently breastfeeding.  Physical Exam:  General: alert, cooperative and no distress Lochia: appropriate Uterine Fundus: firm Incision:  DVT Evaluation: No evidence of DVT seen on physical exam.   Basename 06/08/11 0537 06/07/11 0340  HGB 9.3* 10.4*  HCT 27.8* 31.8*    Assessment/Plan: Discharge home   LOS: 3 days   Kandie Keiper C 06/09/2011, 8:34 AM

## 2011-06-13 NOTE — Discharge Summary (Signed)
Obstetric Discharge Summary Reason for Admission: rupture of membranes Prenatal Procedures: ultrasound Intrapartum Procedures: spontaneous vaginal delivery Postpartum Procedures: none Complications-Operative and Postpartum: none Hemoglobin  Date Value Range Status  06/08/2011 9.3* 12.0-15.0 (g/dL) Final     HCT  Date Value Range Status  06/08/2011 27.8* 36.0-46.0 (%) Final    Discharge Diagnoses: Term Pregnancy-delivered  Discharge Information: Date: 06/13/2011 Activity: pelvic rest Diet: routine Medications: PNV and Ibuprofen Condition: stable Instructions: refer to practice specific booklet Discharge to: home   Newborn Data: Live born female  Birth Weight: 7 lb 13 oz (3544 g) APGAR: 8, 9  Home with mother.  Amol Domanski G 06/13/2011, 9:03 AM

## 2013-04-12 LAB — OB RESULTS CONSOLE RUBELLA ANTIBODY, IGM: RUBELLA: IMMUNE

## 2013-04-12 LAB — OB RESULTS CONSOLE HIV ANTIBODY (ROUTINE TESTING): HIV: NONREACTIVE

## 2013-04-12 LAB — OB RESULTS CONSOLE HEPATITIS B SURFACE ANTIGEN: Hepatitis B Surface Ag: NEGATIVE

## 2013-04-12 LAB — OB RESULTS CONSOLE GC/CHLAMYDIA
Chlamydia: NEGATIVE
Gonorrhea: NEGATIVE

## 2013-04-12 LAB — OB RESULTS CONSOLE ABO/RH: RH TYPE: POSITIVE

## 2013-04-12 LAB — OB RESULTS CONSOLE ANTIBODY SCREEN: Antibody Screen: NEGATIVE

## 2013-04-12 LAB — OB RESULTS CONSOLE RPR: RPR: NONREACTIVE

## 2013-07-14 NOTE — L&D Delivery Note (Signed)
Delivery Note At 1:40 AM a healthy female was delivered via Vaginal, Spontaneous Delivery (Presentation: ; Occiput Anterior).  APGAR 8,9 ; weight pending .   Placenta status: Intact, Spontaneous Manual removal. Adherent to top of fundus, exploration after removal, with no fragments felt Cord: 3 vessels with the following complications: Short.    Anesthesia: Epidural  Episiotomy: none Lacerations: none Suture Repair: NA Est. Blood Loss (mL): 300cc  Mom to postpartum.  Baby to Couplet care / Skin to Skin.  Oliver PilaKathy W Antolin Belsito 10/26/2013, 2:02 AM

## 2013-09-11 DIAGNOSIS — N390 Urinary tract infection, site not specified: Secondary | ICD-10-CM

## 2013-09-11 HISTORY — DX: Urinary tract infection, site not specified: N39.0

## 2013-09-28 LAB — OB RESULTS CONSOLE GBS: GBS: NEGATIVE

## 2013-09-29 ENCOUNTER — Inpatient Hospital Stay (HOSPITAL_COMMUNITY): Payer: 59

## 2013-09-29 ENCOUNTER — Encounter (HOSPITAL_COMMUNITY): Payer: Self-pay | Admitting: *Deleted

## 2013-09-29 ENCOUNTER — Inpatient Hospital Stay (HOSPITAL_COMMUNITY)
Admission: AD | Admit: 2013-09-29 | Discharge: 2013-10-01 | DRG: 781 | Disposition: A | Discharge status: Home / Self Care | Payer: 59 | Source: Ambulatory Visit | Attending: Obstetrics and Gynecology | Admitting: Obstetrics and Gynecology

## 2013-09-29 DIAGNOSIS — O2390 Unspecified genitourinary tract infection in pregnancy, unspecified trimester: Principal | ICD-10-CM | POA: Diagnosis present

## 2013-09-29 DIAGNOSIS — N12 Tubulo-interstitial nephritis, not specified as acute or chronic: Secondary | ICD-10-CM | POA: Diagnosis present

## 2013-09-29 DIAGNOSIS — A6000 Herpesviral infection of urogenital system, unspecified: Secondary | ICD-10-CM | POA: Diagnosis present

## 2013-09-29 DIAGNOSIS — O98519 Other viral diseases complicating pregnancy, unspecified trimester: Secondary | ICD-10-CM | POA: Diagnosis present

## 2013-09-29 DIAGNOSIS — O4700 False labor before 37 completed weeks of gestation, unspecified trimester: Secondary | ICD-10-CM

## 2013-09-29 DIAGNOSIS — L0591 Pilonidal cyst without abscess: Secondary | ICD-10-CM | POA: Diagnosis present

## 2013-09-29 DIAGNOSIS — O2303 Infections of kidney in pregnancy, third trimester: Secondary | ICD-10-CM | POA: Diagnosis present

## 2013-09-29 LAB — DIFFERENTIAL
BLASTS: 0 %
Band Neutrophils: 1 % (ref 0–10)
Basophils Absolute: 0 10*3/uL (ref 0.0–0.1)
Basophils Relative: 0 % (ref 0–1)
Eosinophils Absolute: 0 10*3/uL (ref 0.0–0.7)
Eosinophils Relative: 0 % (ref 0–5)
LYMPHS ABS: 1.4 10*3/uL (ref 0.7–4.0)
LYMPHS PCT: 9 % — AB (ref 12–46)
MONO ABS: 1.1 10*3/uL — AB (ref 0.1–1.0)
MONOS PCT: 7 % (ref 3–12)
Metamyelocytes Relative: 1 %
Myelocytes: 0 %
Neutro Abs: 12.7 10*3/uL — ABNORMAL HIGH (ref 1.7–7.7)
Neutrophils Relative %: 82 % — ABNORMAL HIGH (ref 43–77)
Promyelocytes Absolute: 0 %
nRBC: 0 /100 WBC

## 2013-09-29 LAB — URINALYSIS, ROUTINE W REFLEX MICROSCOPIC
Bilirubin Urine: NEGATIVE
GLUCOSE, UA: NEGATIVE mg/dL
Nitrite: NEGATIVE
PROTEIN: NEGATIVE mg/dL
Specific Gravity, Urine: 1.01 (ref 1.005–1.030)
UROBILINOGEN UA: 0.2 mg/dL (ref 0.0–1.0)
pH: 6 (ref 5.0–8.0)

## 2013-09-29 LAB — CBC
HCT: 30.3 % — ABNORMAL LOW (ref 36.0–46.0)
HEMOGLOBIN: 9.9 g/dL — AB (ref 12.0–15.0)
MCH: 28.4 pg (ref 26.0–34.0)
MCHC: 32.7 g/dL (ref 30.0–36.0)
MCV: 87.1 fL (ref 78.0–100.0)
Platelets: 185 10*3/uL (ref 150–400)
RBC: 3.48 MIL/uL — ABNORMAL LOW (ref 3.87–5.11)
RDW: 13 % (ref 11.5–15.5)
WBC: 15.2 10*3/uL — ABNORMAL HIGH (ref 4.0–10.5)

## 2013-09-29 LAB — URINE MICROSCOPIC-ADD ON

## 2013-09-29 MED ORDER — DEXTROSE 5 % IV SOLN
1.0000 g | Freq: Two times a day (BID) | INTRAVENOUS | Status: DC
  Administered 2013-09-29 – 2013-09-30 (×3): 1 g via INTRAVENOUS
  Filled 2013-09-29 (×4): qty 10

## 2013-09-29 MED ORDER — TERBUTALINE SULFATE 1 MG/ML IJ SOLN: 0.2500 mg | Freq: Once | INTRAMUSCULAR | Status: DC

## 2013-09-29 MED ORDER — PRENATAL MULTIVITAMIN CH
1.0000 | ORAL_TABLET | Freq: Every day | ORAL | Status: DC
  Administered 2013-09-30: 1 via ORAL
  Filled 2013-09-29: qty 1

## 2013-09-29 MED ORDER — ZOLPIDEM TARTRATE 5 MG PO TABS: 5.0000 mg | ORAL_TABLET | Freq: Every evening | ORAL | Status: DC | PRN

## 2013-09-29 MED ORDER — DEXTROSE 5 % IV SOLN: 2.0000 g | Freq: Two times a day (BID) | INTRAVENOUS | Status: DC

## 2013-09-29 MED ORDER — DOCUSATE SODIUM 100 MG PO CAPS
100.0000 mg | ORAL_CAPSULE | Freq: Every day | ORAL | Status: DC
  Administered 2013-09-30: 100 mg via ORAL
  Filled 2013-09-29: qty 1

## 2013-09-29 MED ORDER — SODIUM CHLORIDE 0.9 % IV SOLN
INTRAVENOUS | Status: DC
  Administered 2013-09-29 – 2013-09-30 (×4): via INTRAVENOUS

## 2013-09-29 MED ORDER — LACTATED RINGERS IV SOLN: INTRAVENOUS | Status: DC

## 2013-09-29 MED ORDER — BUTORPHANOL TARTRATE 1 MG/ML IJ SOLN
1.0000 mg | Freq: Once | INTRAMUSCULAR | Status: AC
  Administered 2013-09-29: 1 mg via INTRAVENOUS
  Filled 2013-09-29: qty 1

## 2013-09-29 MED ORDER — ACETAMINOPHEN 325 MG PO TABS: 650.0000 mg | ORAL_TABLET | ORAL | Status: DC | PRN

## 2013-09-29 MED ORDER — CALCIUM CARBONATE ANTACID 500 MG PO CHEW: 2.0000 | CHEWABLE_TABLET | ORAL | Status: DC | PRN

## 2013-09-29 MED ORDER — OXYCODONE-ACETAMINOPHEN 5-325 MG PO TABS: 1.0000 | ORAL_TABLET | Freq: Four times a day (QID) | ORAL | Status: DC | PRN

## 2013-09-29 MED ORDER — LACTATED RINGERS IV BOLUS (SEPSIS): 1000.0000 mL | Freq: Once | INTRAVENOUS | Status: DC

## 2013-09-29 MED ORDER — NIFEDIPINE 10 MG PO CAPS
10.0000 mg | ORAL_CAPSULE | ORAL | Status: DC | PRN
  Administered 2013-09-29 (×2): 10 mg via ORAL
  Filled 2013-09-29 (×2): qty 1

## 2013-09-29 MED ORDER — LACTATED RINGERS IV BOLUS (SEPSIS)
1000.0000 mL | Freq: Once | INTRAVENOUS | Status: AC
  Administered 2013-09-29: 1000 mL via INTRAVENOUS

## 2013-09-29 NOTE — MAU Note (Addendum)
PT SAYS SHE STARTED HURTING BAD AT  615PM.   NO VE IN OFFICE.  GBS-  NEG.    DENIES HV AND MRSA   NOTICED   FEVER  AT 530PM-  NO MEDS.

## 2013-09-29 NOTE — MAU Provider Note (Signed)
History     CSN: [id number]  Arrival date and time: 09/29/13 1906   First Provider Initiated Contact with Patient 09/29/13 1956      Chief Complaint  Patient presents with  . Contractions   HPI  Pt is a 23 yo G2P1001 at [redacted]w[redacted]d weeks IUP here with report of having contractions that started this evening around 1815.  Pt denies vaginal bleeding or leaking of fluid.  Also reports lower pelvic pressure, lower back pain, and fever.  Pt also reports that pilonidal cyst started to open two days ago and has been spotting blood.  She reports calling the surgeon who informed her to place gauze over cyst.    Past Medical History  Diagnosis Date  . No pertinent past medical history     Past Surgical History  Procedure Laterality Date  . Wisdom tooth extraction    . Pilonidal cyst excision  2002  . No past surgeries      History reviewed. No pertinent family history.  History  Substance Use Topics  . Smoking status: Never Smoker   . Smokeless tobacco: Not on file  . Alcohol Use: No    Allergies: No Known Allergies  Prescriptions prior to admission  Medication Sig Dispense Refill  . IRON PO Take 1 tablet by mouth daily.         Review of Systems  Constitutional: Positive for fever and chills.  Gastrointestinal: Positive for abdominal pain (contractions).  Genitourinary: Positive for dysuria and flank pain.       Pelvic pressure  All other systems reviewed and are negative.   Physical Exam   Blood pressure 106/81, pulse 124, temperature 99.5 F (37.5 C), temperature source Oral, resp. rate 20, height 5\' 3"  (1.6 m), weight 96.673 kg (213 lb 2 oz), SpO2 99.00%, unknown if currently breastfeeding.  Physical Exam  Constitutional: She is oriented to person, place, and time. She appears well-developed and well-nourished. No distress.  Appears uncomfortable  HENT:  Head: Normocephalic.  Neck: Normal range of motion. Neck supple.  Cardiovascular: Normal rate, regular rhythm  and normal heart sounds.   Respiratory: Effort normal and breath sounds normal.  GI: Soft. There is tenderness (lower pelvis). There is CVA tenderness (left).  Genitourinary: No bleeding around the vagina. Vaginal discharge (mucusy) found.  Musculoskeletal: Normal range of motion. She exhibits edema (trace pedal).       Back:  Neurological: She is alert and oriented to person, place, and time.  Skin: Skin is warm and dry.  Skin flushed    MAU Course  Procedures  2100 Called Dr. Ellyn Hack > reviewed HPI/exam (orders had been previously placed by Dr. Ellyn Hack for stadol, terbutaline and IV fluids) > continue IV fluids  2110 Report given to Dr. Ellyn Hack who assumes care of patient.   [facility]  Dorathy Kinsman, CNM called Dr. Ellyn Hack w/ results and cervical exam. Pt;s pain decreased from 9/10 to 6/10. Rn unable to give Terb due to tachycardia. Procardia ordered in it's place. Pt has a Development worker, international aid in [place] who drained her Pilonidal cyst last time. Called her today to resume care.   Dilation: 1.5 internally Effacement (%): 50 Station: -3 Exam by:: [place], CNM  Results for orders placed during the hospital encounter of 09/29/13 (from the past 24 hour(s))  URINALYSIS, ROUTINE W REFLEX MICROSCOPIC     Status: Abnormal   Collection Time    09/29/13  7:03 PM      Result Value Ref Range   Color,  Urine YELLOW  YELLOW   APPearance CLEAR  CLEAR   Specific Gravity, Urine 1.010  1.005 - 1.030   pH 6.0  5.0 - 8.0   Glucose, UA NEGATIVE  NEGATIVE mg/dL   Hgb urine dipstick TRACE (*) NEGATIVE   Bilirubin Urine NEGATIVE  NEGATIVE   Ketones, ur >80 (*) NEGATIVE mg/dL   Protein, ur NEGATIVE  NEGATIVE mg/dL   Urobilinogen, UA 0.2  0.0 - 1.0 mg/dL   Nitrite NEGATIVE  NEGATIVE   Leukocytes, UA SMALL (*) NEGATIVE  URINE MICROSCOPIC-ADD ON     Status: Abnormal   Collection Time    09/29/13  7:03 PM      Result Value Ref Range   Squamous Epithelial / LPF MANY (*) RARE   WBC, UA 3-6   <3 WBC/hpf   Bacteria, UA FEW (*) RARE  CBC     Status: Abnormal   Collection Time    09/29/13  8:21 PM      Result Value Ref Range   WBC 15.2 (*) 4.0 - 10.5 K/uL   RBC 3.48 (*) 3.87 - 5.11 MIL/uL   Hemoglobin 9.9 (*) 12.0 - 15.0 g/dL   HCT 08.630.3 (*) 57.836.0 - 46.946.0 %   MCV 87.1  78.0 - 100.0 fL   MCH 28.4  26.0 - 34.0 pg   MCHC 32.7  30.0 - 36.0 g/dL   RDW 62.913.0  52.811.5 - 41.315.5 %   Platelets 185  150 - 400 K/uL  DIFFERENTIAL     Status: Abnormal   Collection Time    09/29/13  8:21 PM      Result Value Ref Range   Neutrophils Relative % 82 (*) 43 - 77 %   Lymphocytes Relative 9 (*) 12 - 46 %   Monocytes Relative 7  3 - 12 %   Eosinophils Relative 0  0 - 5 %   Basophils Relative 0  0 - 1 %   Band Neutrophils 1  0 - 10 %   Metamyelocytes Relative 1     Myelocytes 0     Promyelocytes Absolute 0     Blasts 0     nRBC 0  0 /100 WBC   Neutro Abs 12.7 (*) 1.7 - 7.7 K/uL   Lymphs Abs 1.4  0.7 - 4.0 K/uL   Monocytes Absolute 1.1 (*) 0.1 - 1.0 K/uL   Eosinophils Absolute 0.0  0.0 - 0.7 K/uL   Basophils Absolute 0.0  0.0 - 0.1 K/uL     Assessment and Plan   1. Pyelonephritis complicating pregnancy in third trimester, antepartum   2. Preterm uterine contractions, antepartum   3. Pilonidal cyst    Observe on antenatal per Dr. Ellyn HackBovard. Urine culture, GC/CT, GBS pending  [place] Ismael Karge, CNM 09/29/2013 10:49 PM

## 2013-09-29 NOTE — H&P (Addendum)
Tara Gallagher is a 23 y.o. female G2P1001 at 34+ with contractions, CVAT.  Also pilonidal cyst draining.  Low-grade fever at home 100.5, 99.5 in MAU.  Ill-appearing.  +FM, no LOF, no VB, ctx increasing in intensity and frequency.  Cervix stable over multiple hours. Pt admitted with likely pyelonephritis L CVAT.   Maternal Medical History:  Reason for admission: Contractions.   Fetal activity: Perceived fetal activity is normal.    Prenatal Complications - Diabetes: none.    OB History   Grav Para Term Preterm Abortions TAB SAB Ect Mult Living   2 1 1  0 0 0 0 0 0 1    G1 38wk SVD female G2 present  H/o abn pap, last WNL Serum HSV +   Past Medical History  Diagnosis Date  . No pertinent past medical history    Past Surgical History  Procedure Laterality Date  . Wisdom tooth extraction    . Pilonidal cyst excision  2002  . No past surgeries     Family History: family history is not on file. Noncontrib Social History:  reports that she has never smoked. She does not have any smokeless tobacco history on file. She reports that she does not drink alcohol or use illicit drugs.married, Nature conservation officerstocker at [employer]  Meds PNV All NKDA     Prenatal Transfer Tool  Maternal Diabetes: No Genetic Screening: Normal Maternal Ultrasounds/Referrals: Normal Fetal Ultrasounds or other Referrals:  None Maternal Substance Abuse:  No Significant Maternal Medications:  None Significant Maternal Lab Results:  None GBBS collected Other Comments:  [facility]  On Rocephin.    Review of Systems  Constitutional: Positive for fever.       Low-grade at home (100.5), 99.5 at MAU  HENT: Negative.   Eyes: Negative.   Respiratory: Negative.   Cardiovascular: Negative.   Gastrointestinal: Negative.   Genitourinary:       Pilonidal cyst draining  Musculoskeletal: Negative.   Skin: Negative.   Neurological: Negative.   Psychiatric/Behavioral: Negative.     Dilation: 1 Effacement  (%): 50 Station: -3 Exam by:: Tara Gallagher, CNM Blood pressure 106/81, pulse 124, temperature 98.6 F (37 C), temperature source Oral, resp. rate 20, height 5\' 3"  (1.6 m), weight 96.673 kg (213 lb 2 oz), SpO2 99.00%, unknown if currently breastfeeding. Maternal Exam:  Uterine Assessment: Contraction strength is moderate.  Contraction frequency is regular.   Abdomen: Fundal height is appropriateforgestation.   Fetal presentation: vertex  Introitus: Normal vulva. Normal vagina.  Pelvis: adequate for delivery.   Cervix: Cervix evaluated by digital exam.     Physical Exam  Constitutional: She is oriented to person, place, and time.  Ill-appearing  HENT:  Head: Normocephalic and atraumatic.  Cardiovascular: Normal rate and regular rhythm.   Respiratory: Effort normal and breath sounds normal. No respiratory distress. She has no wheezes.  GI: Soft. Bowel sounds are normal. She exhibits no distension. There is no tenderness.  Musculoskeletal: Normal range of motion.  Neurological: She is alert and oriented to person, place, and time.  Skin: Skin is warm and dry.  Psychiatric: She has a normal mood and affect. Her behavior is normal.   Pilonidal cyst draining, NT, no erythema  Prenatal labs: ABO, Rh:  A+ Antibody:  neg Rubella:  Immune RPR:   NR HBsAg:   neg HIV:   neg GBS:   collected  Hgb 11.9/Pap WNL/Ur Cx neg/GC neg/ Chl neg/First Tri Scr and AFP WNL/glucola 133/Plt 233K/CF neg  Dating by First Tri [place] cw LMP  Anat nl anat, cwd, female  Tdap 08/10/13  Assessment/Plan: 22yo G2P1001 At 34+ admitted with pyelonephritis  Treat with Rocephin 1g IV q 12 Procardia and IVF for ctx Close monitoring.   Pt has arranged OP f/u for pilonidal cyst, not painful  Collect GBBS  BOVARD,Dmitriy Gair 09/29/2013, 9:53 PM

## 2013-09-30 DIAGNOSIS — O2303 Infections of kidney in pregnancy, third trimester: Secondary | ICD-10-CM | POA: Diagnosis present

## 2013-09-30 LAB — TYPE AND SCREEN
ABO/RH(D): A POS
Antibody Screen: NEGATIVE

## 2013-09-30 LAB — ABO/RH: ABO/RH(D): A POS

## 2013-09-30 NOTE — Progress Notes (Signed)
Patient ID: Tara Gallagher, female   DOB: 20-Sep-1990, 23 y.o.   MRN: [id number]  G2P1001 at 34+3 admitted with pyelo  No c/o's.  +FM, no LOF, no VB, occ ctx.  Feeling better, back pain improved  AFVSS gen NAD Lungs CTAB Abd soft, FNT Min CVAT  FHTs 130-155, R toco Q2-593min, then occ  SVE 1cm per RN  22yo G2P1001 at 34+3 with pyelo and pilonidal cyst Continue Rocephin Pain control F/u for pilonidal cyst as OP TID NST

## 2013-10-01 LAB — CULTURE, OB URINE: Colony Count: 40000

## 2013-10-01 LAB — GC/CHLAMYDIA PROBE AMP
CT Probe RNA: NEGATIVE
GC PROBE AMP APTIMA: NEGATIVE

## 2013-10-01 MED ORDER — CEPHALEXIN 500 MG PO CAPS: 500.0000 mg | ORAL_CAPSULE | Freq: Three times a day (TID) | ORAL | Status: DC

## 2013-10-01 NOTE — Discharge Instructions (Signed)
Call for temp >100, increasing pain, severe nausea/vomiting

## 2013-10-01 NOTE — Accreditation Note (Signed)
HD #2, [redacted]W[redacted]d, ?pyelo, pilonidal cyst Feeling much better, no n/v. Pain ok Afeb entire hospital stay, VSS Back-no CVAT, dressing over pilonidal cyst for drainage I am not sure she has pyelonephritis, UA was contaminated and not impressive, no urine culture, no fever, did have elevated WBC but maybe this was from pilonidal cyst.  She feels better, will d/c home on Keflex.

## 2013-10-01 NOTE — Discharge Summary (Signed)
Physician Discharge Summary  Patient ID: Tara [name] MRN: [id number] DOB/AGE: 07-Jun-1991 22 y.o.  Admit date: 09/29/2013 Discharge date: 10/01/2013  Admission Diagnoses:  IUP at 34 weeks, back pain, pilonidal cyst  Discharge Diagnoses:  Same  Discharged Condition: good  Hospital Course: Pt admitted by Dr. Ellyn HackBovard for possible pyelonephritis and pilonidal cyst.  She was put on Rocephin, was never febrile, felt better by 2nd morning.  Pilonidal cyst drained, no CVAT at time of discharge.  UA was contaminated, no urine cx done.  Discharge Exam: Blood pressure 104/37, pulse 82, temperature 98.9 F (37.2 C), temperature source Oral, resp. rate 18, height 5\' 3"  (1.6 m), weight 96.673 kg (213 lb 2 oz), SpO2 99.00%, unknown if currently breastfeeding. General appearance: alert  Disposition: 01-Home or Self Care  Discharge Orders   Future Orders Complete By Expires   Discharge activity:  No Restrictions  As directed    Discharge diet:  No restrictions  As directed    No sexual activity restrictions  As directed    Notify physician for increase or change in vaginal discharge  As directed    Notify physician for leaking of fluid  As directed    Notify physician for uterine contractions.  These may be painless and feel like the uterus is tightening or the baby is  "balling up"  As directed    Notify physician for vaginal bleeding  As directed    PRETERM LABOR:  Includes any of the follwing symptoms that occur between 20 - [redacted] weeks gestation.  If these symptoms are not stopped, preterm labor can result in preterm delivery, placing your baby at risk  As directed        Medication List         cephALEXin 500 MG capsule  Commonly known as:  KEFLEX  Take 1 capsule (500 mg total) by mouth 3 (three) times daily.     prenatal multivitamin Tabs tablet  Take 1 tablet by mouth daily at 12 noon.           Follow-up Information   Please follow up. (as scheduled)        Signed: Rosette Bellavance D 10/01/2013, 9:16 AM

## 2013-10-02 LAB — CULTURE, BETA STREP (GROUP B ONLY)

## 2013-10-25 ENCOUNTER — Inpatient Hospital Stay (HOSPITAL_COMMUNITY)
Admission: AD | Admit: 2013-10-25 | Discharge: 2013-10-27 | DRG: 775 | Disposition: A | Discharge status: Home / Self Care | Payer: 59 | Source: Ambulatory Visit | Attending: Obstetrics and Gynecology | Admitting: Obstetrics and Gynecology

## 2013-10-25 ENCOUNTER — Telehealth (HOSPITAL_COMMUNITY): Payer: Self-pay | Admitting: *Deleted

## 2013-10-25 ENCOUNTER — Encounter (HOSPITAL_COMMUNITY): Payer: Self-pay | Admitting: *Deleted

## 2013-10-25 DIAGNOSIS — N289 Disorder of kidney and ureter, unspecified: Secondary | ICD-10-CM | POA: Diagnosis present

## 2013-10-25 DIAGNOSIS — O26839 Pregnancy related renal disease, unspecified trimester: Principal | ICD-10-CM | POA: Diagnosis present

## 2013-10-25 DIAGNOSIS — O99214 Obesity complicating childbirth: Secondary | ICD-10-CM

## 2013-10-25 DIAGNOSIS — O9902 Anemia complicating childbirth: Secondary | ICD-10-CM | POA: Diagnosis present

## 2013-10-25 DIAGNOSIS — E669 Obesity, unspecified: Secondary | ICD-10-CM | POA: Diagnosis present

## 2013-10-25 DIAGNOSIS — D649 Anemia, unspecified: Secondary | ICD-10-CM | POA: Diagnosis present

## 2013-10-25 DIAGNOSIS — Z6841 Body Mass Index (BMI) 40.0 and over, adult: Secondary | ICD-10-CM

## 2013-10-25 LAB — CBC
HCT: 33.5 % — ABNORMAL LOW (ref 36.0–46.0)
HEMOGLOBIN: 10.8 g/dL — AB (ref 12.0–15.0)
MCH: 27.7 pg (ref 26.0–34.0)
MCHC: 32.2 g/dL (ref 30.0–36.0)
MCV: 85.9 fL (ref 78.0–100.0)
PLATELETS: 206 10*3/uL (ref 150–400)
RBC: 3.9 MIL/uL (ref 3.87–5.11)
RDW: 14 % (ref 11.5–15.5)
WBC: 14 10*3/uL — AB (ref 4.0–10.5)

## 2013-10-25 MED ORDER — BUTORPHANOL TARTRATE 1 MG/ML IJ SOLN
1.0000 mg | INTRAMUSCULAR | Status: DC | PRN
Start: 2013-10-25 — End: 2013-10-26

## 2013-10-25 MED ORDER — LIDOCAINE HCL (PF) 1 % IJ SOLN
30.0000 mL | INTRAMUSCULAR | Status: DC | PRN
  Filled 2013-10-25: qty 30

## 2013-10-25 MED ORDER — OXYTOCIN BOLUS FROM INFUSION: 500.0000 mL | INTRAVENOUS | Status: DC

## 2013-10-25 MED ORDER — OXYCODONE-ACETAMINOPHEN 5-325 MG PO TABS
1.0000 | ORAL_TABLET | ORAL | Status: DC | PRN
Start: 2013-10-25 — End: 2013-10-26

## 2013-10-25 MED ORDER — EPHEDRINE 5 MG/ML INJ
10.0000 mg | INTRAVENOUS | Status: DC | PRN
  Filled 2013-10-25: qty 2
  Filled 2013-10-25: qty 4

## 2013-10-25 MED ORDER — LACTATED RINGERS IV SOLN: 500.0000 mL | INTRAVENOUS | Status: DC | PRN

## 2013-10-25 MED ORDER — ACETAMINOPHEN 325 MG PO TABS: 650.0000 mg | ORAL_TABLET | ORAL | Status: DC | PRN

## 2013-10-25 MED ORDER — LACTATED RINGERS IV SOLN
INTRAVENOUS | Status: DC
Start: 2013-10-25 — End: 2013-10-26

## 2013-10-25 MED ORDER — EPHEDRINE 5 MG/ML INJ
10.0000 mg | INTRAVENOUS | Status: DC | PRN
  Filled 2013-10-25: qty 2

## 2013-10-25 MED ORDER — FENTANYL 2.5 MCG/ML BUPIVACAINE 1/10 % EPIDURAL INFUSION (WH - ANES)
14.0000 mL/h | INTRAMUSCULAR | Status: DC | PRN
  Filled 2013-10-25: qty 125

## 2013-10-25 MED ORDER — DIPHENHYDRAMINE HCL 50 MG/ML IJ SOLN: 12.5000 mg | INTRAMUSCULAR | Status: DC | PRN

## 2013-10-25 MED ORDER — ONDANSETRON HCL 4 MG/2ML IJ SOLN: 4.0000 mg | Freq: Four times a day (QID) | INTRAMUSCULAR | Status: DC | PRN

## 2013-10-25 MED ORDER — LACTATED RINGERS IV SOLN: 500.0000 mL | Freq: Once | INTRAVENOUS | Status: DC

## 2013-10-25 MED ORDER — FLEET ENEMA 7-19 GM/118ML RE ENEM: 1.0000 | ENEMA | RECTAL | Status: DC | PRN

## 2013-10-25 MED ORDER — PHENYLEPHRINE 40 MCG/ML (10ML) SYRINGE FOR IV PUSH (FOR BLOOD PRESSURE SUPPORT)
80.0000 ug | PREFILLED_SYRINGE | INTRAVENOUS | Status: DC | PRN
  Filled 2013-10-25: qty 2

## 2013-10-25 MED ORDER — OXYTOCIN 40 UNITS IN LACTATED RINGERS INFUSION - SIMPLE MED
62.5000 mL/h | INTRAVENOUS | Status: DC
  Administered 2013-10-26: 62.5 mL/h via INTRAVENOUS
  Filled 2013-10-25: qty 1000

## 2013-10-25 MED ORDER — IBUPROFEN 600 MG PO TABS: 600.0000 mg | ORAL_TABLET | Freq: Four times a day (QID) | ORAL | Status: DC | PRN

## 2013-10-25 MED ORDER — CITRIC ACID-SODIUM CITRATE 334-500 MG/5ML PO SOLN: 30.0000 mL | ORAL | Status: DC | PRN

## 2013-10-25 MED ORDER — PHENYLEPHRINE 40 MCG/ML (10ML) SYRINGE FOR IV PUSH (FOR BLOOD PRESSURE SUPPORT)
80.0000 ug | PREFILLED_SYRINGE | INTRAVENOUS | Status: DC | PRN
  Filled 2013-10-25: qty 2
  Filled 2013-10-25: qty 10

## 2013-10-25 NOTE — Progress Notes (Signed)
Patient ID: Tara Gallagher, female   DOB: [birth year], 23 y.o.   MRN: [id number] Pt admitted to L&D, ctx q 3-7 mins Wanted to wait a bit on epidural, so ok with proceeding to AROM FHR reactive, a bit difficult to keep on monitor  60/4-5/-2  AROM clear  Monitor progress.

## 2013-10-25 NOTE — H&P (Signed)
Tara Gallagher is a 23 y.o. female G2P1001 at 1438 0/7 weeks (EDD 4/48/15 by LMP c/w 9 week US) presenting for contractions for several hours that have been intensifying.  Cervical exam last office visit 3-4 cm, and now changed to 4-5 cm.  Prenatal care has been relatively uncomplicated, was admitted at 34 weeks with draining pilonidal cyst and possible pyelonephritis, improved quickly with antibiotics.  Maternal Medical History:  Reason for admission: Contractions.   Contractions: Onset was 3-5 hours ago.   Frequency: regular.   Perceived severity is moderate.    Fetal activity: Perceived fetal activity is normal.    Prenatal Complications - Diabetes: none.    OB History   Grav Para Term Preterm Abortions TAB SAB Ect Mult Living   2 1 1  0 0 0 0 0 0 1    NSVD 2012 7#13oz female  Past Medical History  Diagnosis Date  . No pertinent past medical history    Past Surgical History  Procedure Laterality Date  . Wisdom tooth extraction    . Pilonidal cyst excision  2002  . No past surgeries     Family History: family history is not on file. Social History:  reports that she has never smoked. She has never used smokeless tobacco. She reports that she does not drink alcohol or use illicit drugs.   Prenatal Transfer Tool  Maternal Diabetes: No Genetic Screening: Normal Maternal Ultrasounds/Referrals: Normal Fetal Ultrasounds or other Referrals:  None Maternal Substance Abuse:  No Significant Maternal Medications:  None Significant Maternal Lab Results:  None Other Comments:  None  ROS  Dilation: 4.5 Effacement (%): 70 Station: -2 Exam by::  (L. Paschal, RN) Blood pressure 140/71, pulse 93, temperature 98.1 F (36.7 C), temperature source Oral, resp. rate 18, height 5\' 3"  (1.6 m), weight 101.152 kg (223 lb), SpO2 99.00%, unknown if currently breastfeeding. Maternal Exam:  Uterine Assessment: Contraction strength is moderate.  Contraction frequency is regular.   Abdomen:  Patient reports no abdominal tenderness. Fetal presentation: vertex  Introitus: Normal vulva. Normal vagina.  Pelvis: adequate for delivery.      Physical Exam  Constitutional: She is oriented to person, place, and time. She appears well-developed and well-nourished.  Cardiovascular: Normal rate and regular rhythm.   Respiratory: Effort normal and breath sounds normal.  GI: Soft.  Genitourinary: Vagina normal and uterus normal.  Neurological: She is alert and oriented to person, place, and time.  Psychiatric: She has a normal mood and affect. Her behavior is normal.    Prenatal labs: ABO, Rh: --/--/A POS (03/20 0554) Antibody: NEG (03/20 0554) Rubella: Immune (09/30 0000) RPR: Nonreactive (09/30 0000)  HBsAg: Negative (09/30 0000)  HIV: Non-reactive (09/30 0000)  GBS: Negative (03/18 0000)  First trimester screen and AFP negative One hour glucola 133   Assessment/Plan: Pt admitted with regular contractions and cervical change, desires epidural  Will allow epidural, then AROM.  Oliver PilaKathy W Aylee Littrell 10/25/2013, 11:13 PM

## 2013-10-25 NOTE — Telephone Encounter (Signed)
Preadmission screen  

## 2013-10-25 NOTE — MAU Note (Signed)
Contractions all day, worsening tonight. Back pain.

## 2013-10-26 ENCOUNTER — Inpatient Hospital Stay (HOSPITAL_COMMUNITY): Payer: 59 | Admitting: Anesthesiology

## 2013-10-26 ENCOUNTER — Encounter (HOSPITAL_COMMUNITY): Payer: 59 | Admitting: Anesthesiology

## 2013-10-26 ENCOUNTER — Encounter (HOSPITAL_COMMUNITY): Payer: Self-pay | Admitting: Anesthesiology

## 2013-10-26 LAB — CBC
HCT: 32.9 % — ABNORMAL LOW (ref 36.0–46.0)
Hemoglobin: 10.7 g/dL — ABNORMAL LOW (ref 12.0–15.0)
MCH: 27.9 pg (ref 26.0–34.0)
MCHC: 32.5 g/dL (ref 30.0–36.0)
MCV: 85.9 fL (ref 78.0–100.0)
PLATELETS: 183 10*3/uL (ref 150–400)
RBC: 3.83 MIL/uL — AB (ref 3.87–5.11)
RDW: 13.9 % (ref 11.5–15.5)
WBC: 18 10*3/uL — ABNORMAL HIGH (ref 4.0–10.5)

## 2013-10-26 LAB — RPR

## 2013-10-26 LAB — TYPE AND SCREEN
ABO/RH(D): A POS
Antibody Screen: NEGATIVE

## 2013-10-26 MED ORDER — SIMETHICONE 80 MG PO CHEW: 80.0000 mg | CHEWABLE_TABLET | ORAL | Status: DC | PRN

## 2013-10-26 MED ORDER — DIPHENHYDRAMINE HCL 25 MG PO CAPS: 25.0000 mg | ORAL_CAPSULE | Freq: Four times a day (QID) | ORAL | Status: DC | PRN

## 2013-10-26 MED ORDER — WITCH HAZEL-GLYCERIN EX PADS: 1.0000 "application " | MEDICATED_PAD | CUTANEOUS | Status: DC | PRN

## 2013-10-26 MED ORDER — BENZOCAINE-MENTHOL 20-0.5 % EX AERO: 1.0000 "application " | INHALATION_SPRAY | CUTANEOUS | Status: DC | PRN

## 2013-10-26 MED ORDER — IBUPROFEN 600 MG PO TABS
600.0000 mg | ORAL_TABLET | Freq: Four times a day (QID) | ORAL | Status: DC
  Administered 2013-10-26 – 2013-10-27 (×6): 600 mg via ORAL
  Filled 2013-10-26 (×6): qty 1

## 2013-10-26 MED ORDER — TETANUS-DIPHTH-ACELL PERTUSSIS 5-2.5-18.5 LF-MCG/0.5 IM SUSP: 0.5000 mL | Freq: Once | INTRAMUSCULAR | Status: DC

## 2013-10-26 MED ORDER — SENNOSIDES-DOCUSATE SODIUM 8.6-50 MG PO TABS
2.0000 | ORAL_TABLET | ORAL | Status: DC
  Administered 2013-10-26: 2 via ORAL
  Filled 2013-10-26: qty 2

## 2013-10-26 MED ORDER — PRENATAL MULTIVITAMIN CH
1.0000 | ORAL_TABLET | Freq: Every day | ORAL | Status: DC
  Administered 2013-10-26: 1 via ORAL
  Filled 2013-10-26 (×2): qty 1

## 2013-10-26 MED ORDER — FENTANYL 2.5 MCG/ML BUPIVACAINE 1/10 % EPIDURAL INFUSION (WH - ANES)
INTRAMUSCULAR | Status: DC | PRN
  Administered 2013-10-26: 14 mL/h via EPIDURAL

## 2013-10-26 MED ORDER — ZOLPIDEM TARTRATE 5 MG PO TABS: 5.0000 mg | ORAL_TABLET | Freq: Every evening | ORAL | Status: DC | PRN

## 2013-10-26 MED ORDER — ONDANSETRON HCL 4 MG/2ML IJ SOLN: 4.0000 mg | INTRAMUSCULAR | Status: DC | PRN

## 2013-10-26 MED ORDER — LIDOCAINE HCL (PF) 1 % IJ SOLN
INTRAMUSCULAR | Status: DC | PRN
  Administered 2013-10-26 (×2): 4 mL

## 2013-10-26 MED ORDER — ONDANSETRON HCL 4 MG PO TABS: 4.0000 mg | ORAL_TABLET | ORAL | Status: DC | PRN

## 2013-10-26 MED ORDER — LANOLIN HYDROUS EX OINT: TOPICAL_OINTMENT | CUTANEOUS | Status: DC | PRN

## 2013-10-26 MED ORDER — OXYCODONE-ACETAMINOPHEN 5-325 MG PO TABS: 1.0000 | ORAL_TABLET | ORAL | Status: DC | PRN

## 2013-10-26 MED ORDER — DIBUCAINE 1 % RE OINT: 1.0000 "application " | TOPICAL_OINTMENT | RECTAL | Status: DC | PRN

## 2013-10-26 NOTE — Anesthesia Procedure Notes (Signed)
Epidural Patient location during procedure: OB Start time: 10/26/2013 12:41 AM  Staffing Anesthesiologist: Wardell Pokorski A. Performed by: anesthesiologist   Preanesthetic Checklist Completed: patient identified, site marked, surgical consent, pre-op evaluation, timeout performed, IV checked, risks and benefits discussed and monitors and equipment checked  Epidural Patient position: sitting Prep: site prepped and draped and DuraPrep Patient monitoring: continuous pulse ox and blood pressure Approach: midline Location: L3-L4 Injection technique: LOR air  Needle:  Needle type: Tuohy  Needle gauge: 17 G Needle length: 9 cm and 9 Needle insertion depth: 6 cm Catheter type: closed end flexible Catheter size: 19 Gauge Catheter at skin depth: 11 cm Test dose: negative and Other  Assessment Events: blood not aspirated, injection not painful, no injection resistance, negative IV test and no paresthesia  Additional Notes Patient identified. Risks and benefits discussed including failed block, incomplete  Pain control, post dural puncture headache, nerve damage, paralysis, blood pressure Changes, nausea, vomiting, reactions to medications-both toxic and allergic and post Partum back pain. All questions were answered. Patient expressed understanding and wished to proceed. Sterile technique was used throughout procedure. Epidural site was Dressed with sterile barrier dressing. No paresthesias, signs of intravascular injection Or signs of intrathecal spread were encountered.  Patient was more comfortable after the epidural was dosed. Please see RN's note for documentation of vital signs and FHR which are stable.

## 2013-10-26 NOTE — Anesthesia Postprocedure Evaluation (Signed)
Anesthesia Post Note  Patient: Tara Gallagher  Procedure(s) Performed: * No procedures listed *  Anesthesia type: Epidural  Patient location: Mother/Baby  Post pain: Pain level controlled  Post assessment: Post-op Vital signs reviewed  Last Vitals:  Filed Vitals:   10/26/13 0415  BP: 108/64  Pulse: 74  Temp: 36.8 C  Resp: 18    Post vital signs: Reviewed  Level of consciousness: awake  Complications: No apparent anesthesia complications

## 2013-10-26 NOTE — Anesthesia Preprocedure Evaluation (Signed)
Anesthesia Evaluation  Patient identified by MRN, date of birth, ID band Patient awake    Reviewed: Allergy & Precautions, H&P , Patient's Chart, lab work & pertinent test results  Airway Mallampati: III TM Distance: >3 FB Neck ROM: Full    Dental no notable dental hx. (+) Teeth Intact   Pulmonary neg pulmonary ROS,  breath sounds clear to auscultation  Pulmonary exam normal       Cardiovascular negative cardio ROS  Rhythm:Regular Rate:Normal     Neuro/Psych negative neurological ROS  negative psych ROS   GI/Hepatic negative GI ROS, Neg liver ROS,   Endo/Other  Morbid obesity  Renal/GU Renal diseaseHx/o Pyelonephritis  negative genitourinary   Musculoskeletal negative musculoskeletal ROS (+)   Abdominal (+) + obese,   Peds  Hematology  (+) anemia ,   Anesthesia Other Findings   Reproductive/Obstetrics (+) Pregnancy                           Anesthesia Physical Anesthesia Plan  ASA: III  Anesthesia Plan: Epidural   Post-op Pain Management:    Induction:   Airway Management Planned: Natural Airway  Additional Equipment:   Intra-op Plan:   Post-operative Plan:   Informed Consent: I have reviewed the patients History and Physical, chart, labs and discussed the procedure including the risks, benefits and alternatives for the proposed anesthesia with the patient or authorized representative who has indicated his/her understanding and acceptance.     Plan Discussed with: Anesthesiologist  Anesthesia Plan Comments:         Anesthesia Quick Evaluation

## 2013-10-26 NOTE — Progress Notes (Signed)
Patient ID: Tara Gallagher, female   DOB: Sep 14, 1990, 23 y.o.   MRN: [id number] DOD  Pt doing well. No c/o Bleeding WNL.

## 2013-10-26 NOTE — Lactation Note (Signed)
This note was copied from the chart of Girl Enrique SackMeagan Speas. Lactation Consultation Note  Patient Name: Girl Enrique SackMeagan Badertscher WNUUV'OToday's Date: 10/26/2013 Reason for consult: Initial assessment of this second-time mother and her newborn, now 19 hours postpartum.  This is mom's second child but her first to breastfeed.  Initial LATCH score=7 and baby nursed for 15 minutes. Baby has had several brief latches and three more sustained feedings of 30-45 minutes.  Baby has had her first void but no stool yet.  LC encouraged STS and cue feedings.  Mom aware of how to hand express her colostrum. Mom encouraged to feed baby 8-12 times/24 hours and with feeding cues. LC encouraged review of Baby and Me pp 9, 14 and 20-25 for STS and BF information. LC provided [employer] Resource brochure and reviewed [facility] and list of community and web site resources.     Maternal Data Formula Feeding for Exclusion: No Infant to breast within first hour of birth: Yes (initial LATCH score=7 and baby nursed 15 minutes) Has patient been taught Hand Expression?: Yes (per mom's report) Does the patient have breastfeeding experience prior to this delivery?: No (This is mom's second child but her first to breastfeed.)  Feeding    LATCH Score/Interventions              most recent LATCH assessment, per RN= 8        Lactation Tools Discussed/Used   STS, hand expression, cue feedings  Consult Status Consult Status: Follow-up Date: 10/27/13 Follow-up type: In-patient    Zara ChessJoanne P Kendarious Gudino 10/26/2013, 9:17 PM

## 2013-10-27 MED ORDER — IBUPROFEN 600 MG PO TABS: 600.0000 mg | ORAL_TABLET | Freq: Four times a day (QID) | ORAL | Status: DC

## 2013-10-27 NOTE — Lactation Note (Signed)
This note was copied from the chart of Tara Gallagher. Lactation Consultation Note  Patient Name: Tara Enrique SackMeagan Ericsson JXBJY'NToday's Date: 10/27/2013 Reason for consult: Follow-up assessment Mom reports some mild nipple tenderness on the right breast. She reports baby will latch easier to the right than the left breast. Baby just finished BF and is asleep. LC reviewed ways to help baby latch easier to left breast. Care for sore nipples reviewed, advised to apply EBM. Mom denies any cracking or bleeding. Basic teaching discussed. Engorgement care reviewed if needed. Advised Mom if tenderness does not improve, baby still struggling to latch on left breast, call for OP follow up or come to support group. Advised Mom to call if she would like LC to observe latch before d/c home.   Maternal Data    Feeding Feeding Type: Breast Fed Length of feed: 15 min  LATCH Score/Interventions Latch: Grasps breast easily, tongue down, lips flanged, rhythmical sucking. (encouraged deeper latch)  Audible Swallowing: A few with stimulation  Type of Nipple: Everted at rest and after stimulation  Comfort (Breast/Nipple): Filling, red/small blisters or bruises, mild/mod discomfort  Problem noted: Mild/Moderate discomfort (advised EBM to sore nipple)  Hold (Positioning): No assistance needed to correctly position infant at breast. Intervention(s): Breastfeeding basics reviewed  LATCH Score: 8  Lactation Tools Discussed/Used     Consult Status Consult Status: Complete Date: 10/27/13 Follow-up type: In-patient    Kearney HardKathy Ann Kamryn Messineo 10/27/2013, 11:57 AM

## 2013-10-27 NOTE — Progress Notes (Signed)
Post Partum Day 1 Subjective: no complaints and tolerating PO  Objective: Blood pressure 120/55, pulse 74, temperature 98 F (36.7 C), temperature source Oral, resp. rate 17, height 5\' 3"  (1.6 m), weight 101.152 kg (223 lb), SpO2 99.00%, unknown if currently breastfeeding.  Physical Exam:  General: alert and cooperative Lochia: appropriate Uterine Fundus: firm    Recent Labs  10/25/13 2250 10/26/13 0557  HGB 10.8* 10.7*  HCT 33.5* 32.9*    Assessment/Plan: Discharge home   LOS: 2 days   Tara Gallagher 10/27/2013, 9:02 AM

## 2013-10-27 NOTE — Discharge Summary (Signed)
Obstetric Discharge Summary Reason for Admission: onset of labor Prenatal Procedures: none Intrapartum Procedures: spontaneous vaginal delivery Postpartum Procedures: none Complications-Operative and Postpartum: none Hemoglobin  Date Value Ref Range Status  10/26/2013 10.7* 12.0 - 15.0 g/dL Final     HCT  Date Value Ref Range Status  10/26/2013 32.9* 36.0 - 46.0 % Final    Physical Exam:  General: alert and cooperative Lochia: appropriate Uterine Fundus: firm  Discharge Diagnoses: Term Pregnancy-delivered  Discharge Information: Date: 10/27/2013 Activity: pelvic rest Diet: routine Medications: Ibuprofen Condition: improved Instructions: refer to practice specific booklet Discharge to: home Follow-up Information   Follow up with Oliver PilaICHARDSON,Jhayla Podgorski W, MD In 6 weeks. (incision check)    Specialty:  Obstetrics and Gynecology   Contact information:   [address], [address] [place] [id number] [phone]       Newborn Data: Live born female  Birth Weight: 8 lb 1.5 oz (3670 g) APGAR: 8, 9  Home with mother.  Oliver PilaKathy W Inioluwa Boulay 10/27/2013, 9:05 AM

## 2013-11-04 ENCOUNTER — Inpatient Hospital Stay (HOSPITAL_COMMUNITY): Admission: RE | Admit: 2013-11-04 | Payer: 59 | Source: Ambulatory Visit

## 2013-12-29 ENCOUNTER — Encounter (INDEPENDENT_AMBULATORY_CARE_PROVIDER_SITE_OTHER): Payer: Self-pay | Admitting: Surgery

## 2014-01-11 ENCOUNTER — Encounter (INDEPENDENT_AMBULATORY_CARE_PROVIDER_SITE_OTHER): Payer: Self-pay | Admitting: Surgery

## 2014-01-11 ENCOUNTER — Ambulatory Visit (INDEPENDENT_AMBULATORY_CARE_PROVIDER_SITE_OTHER): Payer: 59 | Admitting: Surgery

## 2014-01-11 VITALS — BP 124/72 | HR 60 | Temp 98.0°F | Ht 63.0 in | Wt 197.0 lb

## 2014-01-11 DIAGNOSIS — K429 Umbilical hernia without obstruction or gangrene: Secondary | ICD-10-CM

## 2014-01-11 NOTE — Progress Notes (Signed)
Patient ID: Tara Gallagher, female   DOB: 08/05/1990, 22 y.o.   MRN: [id number]  Chief Complaint  Patient presents with  . Umbilical Hernia    HPI Tara Gallagher is a 22 y.o. female.  Referred by Dr. Jody Bovard for umbilical hernia  HPI This is a healthy 22-year-old female who is 10 weeks status post spontaneous vaginal delivery. During pregnancy  She had a large umbilical hernia. This has not closed after delivery. This has become uncomfortable and seems to be enlarging slightly. He remains reducible. She denies any GI obstructive symptoms. The patient has no plans for further pregnancies. She is currently on birth control pills.  Past Medical History  Diagnosis Date  . No pertinent past medical history     Past Surgical History  Procedure Laterality Date  . Wisdom tooth extraction    . Pilonidal cyst excision  2002  . No past surgeries      History reviewed. No pertinent family history.  Social History History  Substance Use Topics  . Smoking status: Never Smoker   . Smokeless tobacco: Never Used  . Alcohol Use: No    No Known Allergies  Current Outpatient Prescriptions  Medication Sig Dispense Refill  . ibuprofen (ADVIL,MOTRIN) 600 MG tablet Take 1 tablet (600 mg total) by mouth every 6 (six) hours.  30 tablet  0  . Norgestimate-Ethinyl Estradiol Triphasic (ORTHO TRI-CYCLEN LO) 0.18/0.215/0.25 MG-25 MCG tab Take 1 tablet by mouth daily.      . Prenatal Vit-Fe Fumarate-FA (PRENATAL MULTIVITAMIN) TABS tablet Take 1 tablet by mouth daily at 12 noon.       No current facility-administered medications for this visit.    Review of Systems Review of Systems  Constitutional: Negative for fever, chills and unexpected weight change.  HENT: Negative for congestion, hearing loss, sore throat, trouble swallowing and voice change.   Eyes: Negative for visual disturbance.  Respiratory: Negative for cough and wheezing.   Cardiovascular: Negative for chest pain, palpitations and  leg swelling.  Gastrointestinal: Positive for abdominal pain and abdominal distention. Negative for nausea, vomiting, diarrhea, constipation, blood in stool and anal bleeding.  Genitourinary: Negative for hematuria, vaginal bleeding and difficulty urinating.  Musculoskeletal: Negative for arthralgias.  Skin: Negative for rash and wound.  Neurological: Negative for seizures, syncope and headaches.  Hematological: Negative for adenopathy. Does not bruise/bleed easily.  Psychiatric/Behavioral: Negative for confusion.    Blood pressure 124/72, pulse 60, temperature 98 F (36.7 C), height 5' 3" (1.6 m), weight 197 lb (89.359 kg), unknown if currently breastfeeding.  Physical Exam Physical Exam WDWN in NAD HEENT:  EOMI, sclera anicteric Neck:  No masses, no thyromegaly Lungs:  CTA bilaterally; normal respiratory effort CV:  Regular rate and rhythm; no murmurs Abd:  +bowel sounds, soft, lax muscle tone; protruding umbilicus with 2 cm palpable fascial defect; upper midline rectus diastasis Ext:  Well-perfused; no edema Skin:  Warm, dry; no sign of jaundice  Data Reviewed none  Assessment    Umbilical hernia - reducible Small rectus diastasis     Plan    Umbilical hernia repair with mesh.  The surgical procedure has been discussed with the patient.  Potential risks, benefits, alternative treatments, and expected outcomes have been explained.  All of the patient's questions at this time have been answered.  The likelihood of reaching the patient's treatment goal is good.  The patient understand the proposed surgical procedure and wishes to proceed.         Tara Gallagher   K. 01/11/2014, 2:11 PM

## 2014-01-26 ENCOUNTER — Encounter (HOSPITAL_COMMUNITY): Payer: Self-pay | Admitting: Pharmacy Technician

## 2014-01-28 NOTE — Pre-Procedure Instructions (Addendum)
Tara Gallagher  01/28/2014   Your procedure is scheduled on:  July 22  [facility] at 10:50 AM.  Call this number if you have problems the morning of surgery: [phone]   Remember:   Do not eat food or drink liquids after midnight.   Take these medicines the morning of surgery with A SIP OF WATER: Xanax (if needed), Birth control   STOP/ Do not take Aspirin, Aleve, Naproxen, Advil, Ibuprofen, Motrin, Vitamins, Herbs, or Supplements starting today   Do not wear jewelry, make-up or nail polish.  Do not wear lotions, powders, or perfumes. You may wear deodorant.  Do not shave 48 hours prior to surgery. Men may shave face and neck.  Do not bring valuables to the hospital.  [facility] not [facility] any belongings or valuables.               Contacts, dentures or bridgework may not be worn into surgery.  Leave suitcase in the car. After surgery it may be brought to your room.  For patients admitted to the hospital, discharge time is determined by your treatment team.               Patients discharged the day of surgery will not be allowed to drive home.  Name and phone number of your driver: Family/ Friend  Special Instructions:  Special Instructions: [place] - Preparing for Surgery  Before surgery, you can play an important role.  Because skin is not sterile, your skin needs to be as free of germs as possible.  You can reduce the number of germs on you skin by washing with CHG (chlorahexidine gluconate) soap before surgery.  CHG is an antiseptic cleaner which kills germs and bonds with the skin to continue killing germs even after washing.  Please DO NOT use if you have an allergy to CHG or antibacterial soaps.  If your skin becomes reddened/irritated stop using the CHG and inform your nurse when you arrive at Short Stay.  Do not shave (including legs and underarms) for at least 48 hours prior to the first CHG shower.  You may shave your  face.  Please follow these instructions carefully:   1.  Shower with CHG Soap the night before surgery and the morning of Surgery.  2.  If you choose to wash your hair, wash your hair first as usual with your normal shampoo.  3.  After you shampoo, rinse your hair and body thoroughly to remove the Shampoo.  4.  Use CHG as you would any other liquid soap.  You can apply chg directly  to the skin and wash gently with scrungie or a clean washcloth.  5.  Apply the CHG Soap to your body ONLY FROM THE NECK DOWN.  Do not use on open wounds or open sores.  Avoid contact with your eyes ears, mouth and genitals (private parts).  Wash genitals (private parts)       with your normal soap.  6.  Wash thoroughly, paying special attention to the area where your surgery will be performed.  7.  Thoroughly rinse your body with warm water from the neck down.  8.  DO NOT shower/wash with your normal soap after using and rinsing off the CHG Soap.  9.  Pat yourself dry with a clean towel.            10.  Wear clean pajamas.  11.  Place clean sheets on your bed the night of your first shower and do not sleep with pets.  Day of Surgery  Do not apply any lotions/deodorants the morning of surgery.  Please wear clean clothes to the hospital/surgery center.   Please read over the following fact sheets that you were given: Pain Booklet, Coughing and Deep Breathing and Surgical Site Infection Prevention

## 2014-01-30 ENCOUNTER — Encounter (INDEPENDENT_AMBULATORY_CARE_PROVIDER_SITE_OTHER): Payer: Self-pay

## 2014-01-30 ENCOUNTER — Encounter (HOSPITAL_COMMUNITY): Payer: Self-pay

## 2014-01-30 ENCOUNTER — Ambulatory Visit (HOSPITAL_COMMUNITY)
Admission: RE | Admit: 2014-01-30 | Discharge: 2014-01-30 | Disposition: A | Discharge status: Home / Self Care | Payer: 59 | Source: Ambulatory Visit | Attending: Surgery | Admitting: Surgery

## 2014-01-30 DIAGNOSIS — K429 Umbilical hernia without obstruction or gangrene: Secondary | ICD-10-CM | POA: Insufficient documentation

## 2014-01-30 HISTORY — DX: Urinary tract infection, site not specified: N39.0

## 2014-01-30 LAB — HCG, SERUM, QUALITATIVE: Preg, Serum: NEGATIVE

## 2014-01-30 LAB — CBC
HCT: 36.3 % (ref 36.0–46.0)
Hemoglobin: 11.6 g/dL — ABNORMAL LOW (ref 12.0–15.0)
MCH: 28.4 pg (ref 26.0–34.0)
MCHC: 32 g/dL (ref 30.0–36.0)
MCV: 89 fL (ref 78.0–100.0)
PLATELETS: 270 10*3/uL (ref 150–400)
RBC: 4.08 MIL/uL (ref 3.87–5.11)
RDW: 13.8 % (ref 11.5–15.5)
WBC: 9.7 10*3/uL (ref 4.0–10.5)

## 2014-01-31 MED ORDER — CEFAZOLIN SODIUM-DEXTROSE 2-3 GM-% IV SOLR
2.0000 g | INTRAVENOUS | Status: AC
  Administered 2014-02-01: 2 g via INTRAVENOUS
  Filled 2014-01-31: qty 50

## 2014-02-01 ENCOUNTER — Ambulatory Visit (HOSPITAL_COMMUNITY)
Admission: RE | Admit: 2014-02-01 | Discharge: 2014-02-01 | Disposition: A | Discharge status: Home / Self Care | Payer: 59 | Source: Ambulatory Visit | Attending: Surgery | Admitting: Surgery

## 2014-02-01 ENCOUNTER — Ambulatory Visit (HOSPITAL_COMMUNITY): Payer: 59 | Admitting: Anesthesiology

## 2014-02-01 ENCOUNTER — Encounter (HOSPITAL_COMMUNITY): Payer: Self-pay | Admitting: Anesthesiology

## 2014-02-01 ENCOUNTER — Encounter (HOSPITAL_COMMUNITY): Payer: 59 | Admitting: Anesthesiology

## 2014-02-01 ENCOUNTER — Encounter (HOSPITAL_COMMUNITY)
Admission: RE | Disposition: A | Discharge status: Home / Self Care | Payer: Self-pay | Source: Ambulatory Visit | Attending: Surgery

## 2014-02-01 DIAGNOSIS — Z79899 Other long term (current) drug therapy: Secondary | ICD-10-CM | POA: Diagnosis not present

## 2014-02-01 DIAGNOSIS — K429 Umbilical hernia without obstruction or gangrene: Secondary | ICD-10-CM | POA: Diagnosis not present

## 2014-02-01 HISTORY — PX: UMBILICAL HERNIA REPAIR: SHX196

## 2014-02-01 HISTORY — PX: INSERTION OF MESH: SHX5868

## 2014-02-01 SURGERY — REPAIR, HERNIA, UMBILICAL, ADULT
Anesthesia: General | Site: Abdomen

## 2014-02-01 MED ORDER — CHLORHEXIDINE GLUCONATE 4 % EX LIQD
1.0000 "application " | Freq: Once | CUTANEOUS | Status: DC
  Filled 2014-02-01: qty 15

## 2014-02-01 MED ORDER — PROMETHAZINE HCL 25 MG/ML IJ SOLN
6.2500 mg | INTRAMUSCULAR | Status: AC | PRN
  Administered 2014-02-01: 6.25 mg via INTRAVENOUS

## 2014-02-01 MED ORDER — OXYCODONE-ACETAMINOPHEN 5-325 MG PO TABS
1.0000 | ORAL_TABLET | ORAL | Status: DC | PRN
Start: 2014-02-01 — End: 2014-02-01

## 2014-02-01 MED ORDER — ONDANSETRON HCL 4 MG/2ML IJ SOLN
4.0000 mg | INTRAMUSCULAR | Status: DC | PRN
  Administered 2014-02-01: 4 mg via INTRAVENOUS

## 2014-02-01 MED ORDER — PROPOFOL 10 MG/ML IV BOLUS
INTRAVENOUS | Status: DC | PRN
Start: 2014-02-01 — End: 2014-02-01
  Administered 2014-02-01 (×2): 30 mg via INTRAVENOUS
  Administered 2014-02-01: 200 mg via INTRAVENOUS

## 2014-02-01 MED ORDER — LACTATED RINGERS IV SOLN
INTRAVENOUS | Status: DC
  Administered 2014-02-01: 11:00:00 via INTRAVENOUS

## 2014-02-01 MED ORDER — ONDANSETRON HCL 4 MG/2ML IJ SOLN
INTRAMUSCULAR | Status: AC
  Filled 2014-02-01: qty 2

## 2014-02-01 MED ORDER — BUPIVACAINE-EPINEPHRINE 0.5% -1:200000 IJ SOLN
INTRAMUSCULAR | Status: DC | PRN
  Administered 2014-02-01: 10 mL

## 2014-02-01 MED ORDER — GLYCOPYRROLATE 0.2 MG/ML IJ SOLN
INTRAMUSCULAR | Status: DC | PRN
  Administered 2014-02-01: 0.4 mg via INTRAVENOUS

## 2014-02-01 MED ORDER — SUCCINYLCHOLINE CHLORIDE 20 MG/ML IJ SOLN
INTRAMUSCULAR | Status: AC
  Filled 2014-02-01: qty 1

## 2014-02-01 MED ORDER — ROCURONIUM BROMIDE 50 MG/5ML IV SOLN
INTRAVENOUS | Status: AC
  Filled 2014-02-01: qty 1

## 2014-02-01 MED ORDER — LIDOCAINE HCL (CARDIAC) 20 MG/ML IV SOLN
INTRAVENOUS | Status: DC | PRN
  Administered 2014-02-01: 80 mg via INTRAVENOUS

## 2014-02-01 MED ORDER — 0.9 % SODIUM CHLORIDE (POUR BTL) OPTIME
TOPICAL | Status: DC | PRN
  Administered 2014-02-01: 1000 mL

## 2014-02-01 MED ORDER — OXYCODONE-ACETAMINOPHEN 5-325 MG PO TABS: 1.0000 | ORAL_TABLET | ORAL | Status: DC | PRN

## 2014-02-01 MED ORDER — PROPOFOL 10 MG/ML IV BOLUS
INTRAVENOUS | Status: AC
  Filled 2014-02-01: qty 20

## 2014-02-01 MED ORDER — NEOSTIGMINE METHYLSULFATE 10 MG/10ML IV SOLN: INTRAVENOUS | Status: DC | PRN

## 2014-02-01 MED ORDER — DEXAMETHASONE SODIUM PHOSPHATE 4 MG/ML IJ SOLN
INTRAMUSCULAR | Status: AC
  Filled 2014-02-01: qty 1

## 2014-02-01 MED ORDER — NEOSTIGMINE METHYLSULFATE 10 MG/10ML IV SOLN
INTRAVENOUS | Status: AC
  Filled 2014-02-01: qty 1

## 2014-02-01 MED ORDER — LIDOCAINE HCL (CARDIAC) 20 MG/ML IV SOLN
INTRAVENOUS | Status: AC
  Filled 2014-02-01: qty 5

## 2014-02-01 MED ORDER — PROMETHAZINE HCL 25 MG/ML IJ SOLN
INTRAMUSCULAR | Status: AC
  Filled 2014-02-01: qty 1

## 2014-02-01 MED ORDER — SUCCINYLCHOLINE CHLORIDE 20 MG/ML IJ SOLN
INTRAMUSCULAR | Status: DC | PRN
  Administered 2014-02-01: 70 mg via INTRAVENOUS

## 2014-02-01 MED ORDER — ROCURONIUM BROMIDE 100 MG/10ML IV SOLN
INTRAVENOUS | Status: DC | PRN
  Administered 2014-02-01: 20 mg via INTRAVENOUS

## 2014-02-01 MED ORDER — ONDANSETRON HCL 4 MG/2ML IJ SOLN: 4.0000 mg | Freq: Once | INTRAMUSCULAR | Status: DC | PRN

## 2014-02-01 MED ORDER — NEOSTIGMINE METHYLSULFATE 10 MG/10ML IV SOLN
INTRAVENOUS | Status: DC | PRN
  Administered 2014-02-01: 3 mg via INTRAVENOUS

## 2014-02-01 MED ORDER — PROMETHAZINE HCL 25 MG/ML IJ SOLN: 6.2500 mg | Freq: Once | INTRAMUSCULAR | Status: DC

## 2014-02-01 MED ORDER — HYDROMORPHONE HCL PF 1 MG/ML IJ SOLN
0.2500 mg | INTRAMUSCULAR | Status: DC | PRN
  Administered 2014-02-01 (×2): 0.5 mg via INTRAVENOUS

## 2014-02-01 MED ORDER — FENTANYL CITRATE 0.05 MG/ML IJ SOLN
INTRAMUSCULAR | Status: AC
  Filled 2014-02-01: qty 5

## 2014-02-01 MED ORDER — MORPHINE SULFATE 2 MG/ML IJ SOLN: 2.0000 mg | INTRAMUSCULAR | Status: DC | PRN

## 2014-02-01 MED ORDER — GLYCOPYRROLATE 0.2 MG/ML IJ SOLN
INTRAMUSCULAR | Status: AC
  Filled 2014-02-01: qty 2

## 2014-02-01 MED ORDER — HYDROMORPHONE HCL PF 1 MG/ML IJ SOLN
INTRAMUSCULAR | Status: AC
  Filled 2014-02-01: qty 1

## 2014-02-01 MED ORDER — ONDANSETRON HCL 4 MG/2ML IJ SOLN
INTRAMUSCULAR | Status: DC | PRN
  Administered 2014-02-01: 4 mg via INTRAVENOUS

## 2014-02-01 MED ORDER — DEXAMETHASONE SODIUM PHOSPHATE 4 MG/ML IJ SOLN
INTRAMUSCULAR | Status: DC | PRN
  Administered 2014-02-01: 4 mg via INTRAVENOUS

## 2014-02-01 MED ORDER — FENTANYL CITRATE 0.05 MG/ML IJ SOLN
INTRAMUSCULAR | Status: DC | PRN
  Administered 2014-02-01: 100 ug via INTRAVENOUS

## 2014-02-01 SURGICAL SUPPLY — 44 items
APL SKNCLS STERI-STRIP NONHPOA (GAUZE/BANDAGES/DRESSINGS) ×1
BENZOIN TINCTURE PRP APPL 2/3 (GAUZE/BANDAGES/DRESSINGS) ×3 IMPLANT
BLADE SURG ROTATE 9660 (MISCELLANEOUS) IMPLANT
CANISTER SUCTION 2500CC (MISCELLANEOUS) IMPLANT
CHLORAPREP W/TINT 26ML (MISCELLANEOUS) ×3 IMPLANT
CLOSURE WOUND 1/2 X4 (GAUZE/BANDAGES/DRESSINGS) ×1
COVER SURGICAL LIGHT HANDLE (MISCELLANEOUS) ×3 IMPLANT
DRAPE PED LAPAROTOMY (DRAPES) ×3 IMPLANT
DRAPE UTILITY 15X26 W/TAPE STR (DRAPE) ×6 IMPLANT
DRSG TEGADERM 4X4.75 (GAUZE/BANDAGES/DRESSINGS) ×2 IMPLANT
ELECT CAUTERY BLADE 6.4 (BLADE) ×3 IMPLANT
ELECT REM PT RETURN 9FT ADLT (ELECTROSURGICAL) ×3
ELECTRODE REM PT RTRN 9FT ADLT (ELECTROSURGICAL) ×1 IMPLANT
GAUZE SPONGE 4X4 16PLY XRAY LF (GAUZE/BANDAGES/DRESSINGS) ×3 IMPLANT
GLOVE BIO SURGEON STRL SZ7 (GLOVE) ×3 IMPLANT
GLOVE BIOGEL PI IND STRL 7.5 (GLOVE) ×1 IMPLANT
GLOVE BIOGEL PI INDICATOR 7.5 (GLOVE) ×2
GOWN STRL REUS W/ TWL LRG LVL3 (GOWN DISPOSABLE) ×2 IMPLANT
GOWN STRL REUS W/TWL LRG LVL3 (GOWN DISPOSABLE) ×6
KIT BASIN OR (CUSTOM PROCEDURE TRAY) ×3 IMPLANT
KIT ROOM TURNOVER OR (KITS) ×3 IMPLANT
MESH VENTRALEX ST 1-7/10 CRC S (Mesh General) ×2 IMPLANT
NDL HYPO 25GX1X1/2 BEV (NEEDLE) ×1 IMPLANT
NEEDLE HYPO 25GX1X1/2 BEV (NEEDLE) ×3 IMPLANT
NS IRRIG 1000ML POUR BTL (IV SOLUTION) ×3 IMPLANT
PACK SURGICAL SETUP 50X90 (CUSTOM PROCEDURE TRAY) ×3 IMPLANT
PAD ARMBOARD 7.5X6 YLW CONV (MISCELLANEOUS) ×6 IMPLANT
PENCIL BUTTON HOLSTER BLD 10FT (ELECTRODE) ×3 IMPLANT
SPONGE GAUZE 4X4 12PLY (GAUZE/BANDAGES/DRESSINGS) ×3 IMPLANT
SPONGE GAUZE 4X4 12PLY STER LF (GAUZE/BANDAGES/DRESSINGS) ×2 IMPLANT
SPONGE LAP 18X18 X RAY DECT (DISPOSABLE) ×3 IMPLANT
STRIP CLOSURE SKIN 1/2X4 (GAUZE/BANDAGES/DRESSINGS) ×2 IMPLANT
SUT MNCRL AB 4-0 PS2 18 (SUTURE) ×3 IMPLANT
SUT NOVA NAB GS-21 0 18 T12 DT (SUTURE) ×6 IMPLANT
SUT VIC AB 3-0 SH 27 (SUTURE) ×6
SUT VIC AB 3-0 SH 27X BRD (SUTURE) ×1 IMPLANT
SYR BULB 3OZ (MISCELLANEOUS) ×3 IMPLANT
SYR CONTROL 10ML LL (SYRINGE) ×3 IMPLANT
TOWEL OR 17X24 6PK STRL BLUE (TOWEL DISPOSABLE) ×3 IMPLANT
TOWEL OR 17X26 10 PK STRL BLUE (TOWEL DISPOSABLE) ×3 IMPLANT
TUBE CONNECTING 12'X1/4 (SUCTIONS)
TUBE CONNECTING 12X1/4 (SUCTIONS) IMPLANT
WATER STERILE IRR 1000ML POUR (IV SOLUTION) IMPLANT
YANKAUER SUCT BULB TIP NO VENT (SUCTIONS) IMPLANT

## 2014-02-01 NOTE — Transfer of Care (Signed)
Immediate Anesthesia Transfer of Care Note  Patient: Tara Gallagher  Procedure(s) Performed: Procedure(s): HERNIA REPAIR UMBILICAL ADULT WITH MESH (N/A) INSERTION OF MESH (N/A)  Patient Location: PACU  Anesthesia Type:General  Level of Consciousness: awake, alert , oriented, patient cooperative and responds to stimulation  Airway & Oxygen Therapy: Patient Spontanous Breathing  Post-op Assessment: Post -op Vital signs reviewed and stable and Patient moving all extremities X 4  Post vital signs: Reviewed and stable  Complications: No apparent anesthesia complications

## 2014-02-01 NOTE — Anesthesia Preprocedure Evaluation (Signed)
Anesthesia Evaluation  Patient identified by MRN, date of birth, ID band Patient awake    Reviewed: Allergy & Precautions, H&P , NPO status , Patient's Chart, lab work & pertinent test results  Airway       Dental   Pulmonary          Cardiovascular     Neuro/Psych    GI/Hepatic   Endo/Other    Renal/GU Renal disease     Musculoskeletal   Abdominal   Peds  Hematology   Anesthesia Other Findings   Reproductive/Obstetrics                           Anesthesia Physical Anesthesia Plan  ASA: I  Anesthesia Plan: General   Post-op Pain Management:    Induction: Intravenous  Airway Management Planned: Oral ETT  Additional Equipment:   Intra-op Plan:   Post-operative Plan: Extubation in OR  Informed Consent: I have reviewed the patients History and Physical, chart, labs and discussed the procedure including the risks, benefits and alternatives for the proposed anesthesia with the patient or authorized representative who has indicated his/her understanding and acceptance.     Plan Discussed with: Anesthesiologist, CRNA and Surgeon  Anesthesia Plan Comments:         Anesthesia Quick Evaluation

## 2014-02-01 NOTE — Discharge Instructions (Signed)
[facility], [place]  UMBILICAL HERNIA REPAIR: POST OP INSTRUCTIONS  Always review your discharge instruction sheet given to you by the facility where your surgery was performed. IF YOU HAVE DISABILITY OR FAMILY LEAVE FORMS, YOU MUST BRING THEM TO THE OFFICE FOR PROCESSING.   DO NOT GIVE THEM TO YOUR DOCTOR.  1. A  prescription for pain medication may be given to you upon discharge.  Take your pain medication as prescribed, if needed.  If narcotic pain medicine is not needed, then you may take acetaminophen (Tylenol) or ibuprofen (Advil) as needed. 2. Take your usually prescribed medications unless otherwise directed. 3. If you need a refill on your pain medication, please contact your pharmacy.  They will contact our office to request authorization. Prescriptions will not be filled after 5 pm or on week-ends. 4. You should follow a light diet the first 24 hours after arrival home, such as soup and crackers, etc.  Be sure to include lots of fluids daily.  Resume your normal diet the day after surgery. 5. Most patients will experience some swelling and bruising around the umbilicus or in the groin and scrotum.  Ice packs and reclining will help.  Swelling and bruising can take several days to resolve.  6. It is common to experience some constipation if taking pain medication after surgery.  Increasing fluid intake and taking a stool softener (such as Colace) will usually help or prevent this problem from occurring.  A mild laxative (Milk of Magnesia or Miralax) should be taken according to package directions if there are no bowel movements after 48 hours. 7. Unless discharge instructions indicate otherwise, you may remove your bandages 24-48 hours after surgery, and you may shower at that time.  You will have steri-strips (small skin tapes) in place directly over the incision.  These strips should be left on the skin for 7-10 days. 8. ACTIVITIES:  You may resume regular (light) daily activities  beginning the next day--such as daily self-care, walking, climbing stairs--gradually increasing activities as tolerated.  You may have sexual intercourse when it is comfortable.  Refrain from any heavy lifting or straining until approved by your doctor. a. You may drive when you are no longer taking prescription pain medication, you can comfortably wear a seatbelt, and you can safely maneuver your car and apply brakes. b. RETURN TO WORK:  2-3 weeks with light duty - no lifting over 15 lbs. 9. You should see your doctor in the office for a follow-up appointment approximately 2-3 weeks after your surgery.  Make sure that you call for this appointment within a day or two after you arrive home to insure a convenient appointment time. 10. OTHER INSTRUCTIONS:  __________________________________________________________________________________________________________________________________________________________________________________________  WHEN TO CALL YOUR DOCTOR: 1. Fever over 101.0 2. Inability to urinate 3. Nausea and/or vomiting 4. Extreme swelling or bruising 5. Continued bleeding from incision. 6. Increased pain, redness, or drainage from the incision  The clinic staff is available to answer your questions during regular business hours.  Please dont hesitate to call and ask to speak to one of the nurses for clinical concerns.  If you have a medical emergency, go to the nearest emergency room or call 911.  A surgeon from [facility] is always on call at the hospital   [address] [place], [place], [place]  [address] ?  [address], [place], [place]   [address] [phone]    FAX [phone] Web site: [email or web address]  What to eat:  For your first meals, you should eat lightly; only small meals initially.  If you do not have nausea, you may eat larger meals.  Avoid spicy, greasy and heavy food.    General Anesthesia, Adult, Care  After  Refer to this sheet in the next few weeks. These instructions provide you with information on caring for yourself after your procedure. Your health care provider may also give you more specific instructions. Your treatment has been planned according to current medical practices, but problems sometimes occur. Call your health care provider if you have any problems or questions after your procedure.  WHAT TO EXPECT AFTER THE PROCEDURE  After the procedure, it is typical to experience:  Sleepiness.  Nausea and vomiting. HOME CARE INSTRUCTIONS  For the first 24 hours after general anesthesia:  Have a responsible person with you.  Do not drive a car. If you are alone, do not take public transportation.  Do not drink alcohol.  Do not take medicine that has not been prescribed by your health care provider.  Do not sign important papers or make important decisions.  You may resume a normal diet and activities as directed by your health care provider.  Change bandages (dressings) as directed.  If you have questions or problems that seem related to general anesthesia, call the hospital and ask for the anesthetist or anesthesiologist on call. SEEK MEDICAL CARE IF:  You have nausea and vomiting that continue the day after anesthesia.  You develop a rash. SEEK IMMEDIATE MEDICAL CARE IF:  You have difficulty breathing.  You have chest pain.  You have any allergic problems. Document Released: 10/06/2000 Document Revised: 03/02/2013 Document Reviewed: 01/13/2013  [facility] Patient Information 2014 [place] [place].

## 2014-02-01 NOTE — H&P (View-Only) (Signed)
Patient ID: Tara Gallagher, female   DOB: January 18, 1991, 23 y.o.   MRN: [id number]  Chief Complaint  Patient presents with  . Umbilical Hernia    HPI Rever E Tara Gallagher is a 23 y.o. female.  Referred by Dr. Sherron MondayJody Gallagher for umbilical hernia  HPI This is a healthy 23 year old female who is 10 weeks status post spontaneous vaginal delivery. During pregnancy  She had a large umbilical hernia. This has not closed after delivery. This has become uncomfortable and seems to be enlarging slightly. He remains reducible. She denies any GI obstructive symptoms. The patient has no plans for further pregnancies. She is currently on birth control pills.  Past Medical History  Diagnosis Date  . No pertinent past medical history     Past Surgical History  Procedure Laterality Date  . Wisdom tooth extraction    . Pilonidal cyst excision  2002  . No past surgeries      History reviewed. No pertinent family history.  Social History History  Substance Use Topics  . Smoking status: Never Smoker   . Smokeless tobacco: Never Used  . Alcohol Use: No    No Known Allergies  Current Outpatient Prescriptions  Medication Sig Dispense Refill  . ibuprofen (ADVIL,MOTRIN) 600 MG tablet Take 1 tablet (600 mg total) by mouth every 6 (six) hours.  30 tablet  0  . Norgestimate-Ethinyl Estradiol Triphasic (ORTHO TRI-CYCLEN LO) 0.18/0.215/0.25 MG-25 MCG tab Take 1 tablet by mouth daily.      . Prenatal Vit-Fe Fumarate-FA (PRENATAL MULTIVITAMIN) TABS tablet Take 1 tablet by mouth daily at 12 noon.       No current facility-administered medications for this visit.    Review of Systems Review of Systems  Constitutional: Negative for fever, chills and unexpected weight change.  HENT: Negative for congestion, hearing loss, sore throat, trouble swallowing and voice change.   Eyes: Negative for visual disturbance.  Respiratory: Negative for cough and wheezing.   Cardiovascular: Negative for chest pain, palpitations and  leg swelling.  Gastrointestinal: Positive for abdominal pain and abdominal distention. Negative for nausea, vomiting, diarrhea, constipation, blood in stool and anal bleeding.  Genitourinary: Negative for hematuria, vaginal bleeding and difficulty urinating.  Musculoskeletal: Negative for arthralgias.  Skin: Negative for rash and wound.  Neurological: Negative for seizures, syncope and headaches.  Hematological: Negative for adenopathy. Does not bruise/bleed easily.  Psychiatric/Behavioral: Negative for confusion.    Blood pressure 124/72, pulse 60, temperature 98 F (36.7 C), height 5\' 3"  (1.6 m), weight 197 lb (89.359 kg), unknown if currently breastfeeding.  Physical Exam Physical Exam WDWN in NAD HEENT:  EOMI, sclera anicteric Neck:  No masses, no thyromegaly Lungs:  CTA bilaterally; normal respiratory effort CV:  Regular rate and rhythm; no murmurs Abd:  +bowel sounds, soft, lax muscle tone; protruding umbilicus with 2 cm palpable fascial defect; upper midline rectus diastasis Ext:  Well-perfused; no edema Skin:  Warm, dry; no sign of jaundice  Data Reviewed none  Assessment    Umbilical hernia - reducible Small rectus diastasis     Plan    Umbilical hernia repair with mesh.  The surgical procedure has been discussed with the patient.  Potential risks, benefits, alternative treatments, and expected outcomes have been explained.  All of the patient's questions at this time have been answered.  The likelihood of reaching the patient's treatment goal is good.  The patient understand the proposed surgical procedure and wishes to proceed.         Tara Gallagher  K. 01/11/2014, 2:11 PM

## 2014-02-01 NOTE — Progress Notes (Signed)
Report given to philip rn as caregiver 

## 2014-02-01 NOTE — Op Note (Signed)
Indications:  The patient presented with a history of an enlarging reducible umbilical hernia.  The patient was examined and we recommended umbilical hernia repair with mesh.  Pre-operative diagnosis:  Umbilical hernia  Post-operative diagnosis:  Same  Surgeon: Naarah Borgerding K.   Assistants: none  Anesthesia: General endotracheal anesthesia  ASA Class: 2   Procedure Details  The patient was seen again in the Holding Room. The risks, benefits, complications, treatment options, and expected outcomes were discussed with the patient. The possibilities of reaction to medication, pulmonary aspiration, perforation of viscus, bleeding, recurrent infection, the need for additional procedures, and development of a complication requiring transfusion or further operation were discussed with the patient and/or family. There was concurrence with the proposed plan, and informed consent was obtained. The site of surgery was properly noted/marked. The patient was taken to the Operating Room, identified as Tara Gallagher, and the procedure verified as umbilical hernia repair. A Time Out was held and the above information confirmed.  After an adequate level of general anesthesia was obtained, the patient's abdomen was prepped with Chloraprep and draped in sterile fashion.  We made a transverse incision below the umbilicus.  Dissection was carried down to the hernia sac with cautery.  We dissected bluntly around the hernia sac down to the edge of the fascial defect.  We reduced the hernia sac back into the pre-peritoneal space.  The fascial defect measured 1.8 cm.  We cleared the fascia in all directions.  The patient has an upper midline rectus diastasis, but no other palpable hernia.   A small Ventralex mesh was inserted into the pre-peritoneal space and was deployed.  The mesh was secured with four trans-fascial sutures of 0 Novofil.  The fascial defect was closed with multiple interrupted figure-of-eight 1 Novofil  sutures.  The base of the umbilicus was tacked down with 3-0 Vicryl.  3-0 Vicryl was used to close the subcutaneous tissues and 4-0 Monocryl was used to close the skin.  Steri-strips and clean dressing were applied.  The patient was extubated and brought to the recovery room in stable condition.  All sponge, instrument, and needle counts were correct prior to closure and at the conclusion of the case.   Estimated Blood Loss: Minimal          Complications: None; patient tolerated the procedure well.         Disposition: PACU - hemodynamically stable.         Condition: stable  Wilmon ArmsMatthew K. Corliss Skainssuei, MD, [facility]  General/ Trauma Surgery  02/01/2014 1:03 PM

## 2014-02-01 NOTE — Interval H&P Note (Signed)
History and Physical Interval Note:  02/01/2014 11:26 AM  Tara Gallagher  has presented today for surgery, with the diagnosis of UMBILICAL HERNIA  The various methods of treatment have been discussed with the patient and family. After consideration of risks, benefits and other options for treatment, the patient has consented to  Procedure(s): HERNIA REPAIR UMBILICAL ADULT WITH MESH (N/A) INSERTION OF MESH (N/A) as a surgical intervention .  The patient's history has been reviewed, patient examined, no change in status, stable for surgery.  I have reviewed the patient's chart and labs.  Questions were answered to the patient's satisfaction.     Tatem Holsonback K.

## 2014-02-02 ENCOUNTER — Telehealth (INDEPENDENT_AMBULATORY_CARE_PROVIDER_SITE_OTHER): Payer: Self-pay

## 2014-02-02 ENCOUNTER — Encounter (HOSPITAL_COMMUNITY): Payer: Self-pay | Admitting: Surgery
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
## 2014-02-02 NOTE — Anesthesia Postprocedure Evaluation (Signed)
  Anesthesia Post-op Note  Patient: Tara Gallagher  Procedure(s) Performed: Procedure(s): HERNIA REPAIR UMBILICAL ADULT WITH MESH (N/A) INSERTION OF MESH (N/A)  Patient Location: PACU  Anesthesia Type:General  Level of Consciousness: awake, alert , oriented and patient cooperative  Airway and Oxygen Therapy: Patient Spontanous Breathing  Post-op Pain: mild  Post-op Assessment: Post-op Vital signs reviewed, Patient's Cardiovascular Status Stable, Respiratory Function Stable, Patent Airway and Pain level controlled, nausea improved  Post-op Vital Signs: Reviewed and stable  Last Vitals:  Filed Vitals:   02/01/14 1720  BP:   Pulse:   Temp: 36.3 C  Resp:     Complications: No apparent anesthesia complications (pt eval in PACU, delayed entry)

## 2014-02-20 ENCOUNTER — Encounter (INDEPENDENT_AMBULATORY_CARE_PROVIDER_SITE_OTHER): Payer: Self-pay | Admitting: Surgery

## 2014-02-20 ENCOUNTER — Ambulatory Visit (INDEPENDENT_AMBULATORY_CARE_PROVIDER_SITE_OTHER): Payer: 59 | Admitting: Surgery

## 2014-02-20 VITALS — BP 120/70 | HR 68 | Resp 16 | Ht 63.0 in | Wt 192.4 lb

## 2014-02-20 DIAGNOSIS — K429 Umbilical hernia without obstruction or gangrene: Secondary | ICD-10-CM

## 2014-02-20 NOTE — Progress Notes (Signed)
Status post umbilical hernia repair with mesh on 02/01/14. Her defect measured 1.8 cm. We repaired this with a small ventral X. Mesh. She is doing quite well. No pain. No sign of recurrent hernia. Her incision is well healed. She still has a small upper midline rectus diastases.  She may resume full activity.  Follow-up PRN.  Wilmon ArmsMatthew K. Corliss Skainssuei, MD, [facility]  General/ Trauma Surgery  02/20/2014 8:47 AM

## 2014-02-23 ENCOUNTER — Encounter (HOSPITAL_COMMUNITY): Payer: Self-pay | Admitting: Emergency Medicine

## 2014-02-23 ENCOUNTER — Emergency Department (HOSPITAL_COMMUNITY)
Admission: EM | Admit: 2014-02-23 | Discharge: 2014-02-23 | Disposition: A | Discharge status: Home / Self Care | Payer: 59 | Attending: Emergency Medicine | Admitting: Emergency Medicine

## 2014-02-23 DIAGNOSIS — S0993XA Unspecified injury of face, initial encounter: Secondary | ICD-10-CM | POA: Insufficient documentation

## 2014-02-23 DIAGNOSIS — S46819A Strain of other muscles, fascia and tendons at shoulder and upper arm level, unspecified arm, initial encounter: Principal | ICD-10-CM

## 2014-02-23 DIAGNOSIS — S199XXA Unspecified injury of neck, initial encounter: Secondary | ICD-10-CM

## 2014-02-23 DIAGNOSIS — Y9389 Activity, other specified: Secondary | ICD-10-CM | POA: Insufficient documentation

## 2014-02-23 DIAGNOSIS — Z79899 Other long term (current) drug therapy: Secondary | ICD-10-CM | POA: Diagnosis not present

## 2014-02-23 DIAGNOSIS — Y92410 Unspecified street and highway as the place of occurrence of the external cause: Secondary | ICD-10-CM | POA: Diagnosis not present

## 2014-02-23 DIAGNOSIS — S46811A Strain of other muscles, fascia and tendons at shoulder and upper arm level, right arm, initial encounter: Secondary | ICD-10-CM

## 2014-02-23 DIAGNOSIS — S43499A Other sprain of unspecified shoulder joint, initial encounter: Secondary | ICD-10-CM | POA: Insufficient documentation

## 2014-02-23 DIAGNOSIS — Z87440 Personal history of urinary (tract) infections: Secondary | ICD-10-CM | POA: Insufficient documentation

## 2014-02-23 MED ORDER — TRAMADOL HCL 50 MG PO TABS: 50.0000 mg | ORAL_TABLET | Freq: Four times a day (QID) | ORAL | Status: DC | PRN
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
## 2014-02-23 NOTE — Discharge Instructions (Signed)
Take Tramadol as needed for pain. Apply heat to your injury as needed. Refer to attached documents for more information. Return to the ED with worsening or concerning symptoms.

## 2014-02-24 NOTE — ED Provider Notes (Signed)
Medical screening examination/treatment/procedure(s) were performed by non-physician practitioner and as supervising physician I was immediately available for consultation/collaboration.  Toy CookeyMegan Dian Laprade, MD 02/24/14 2029

## 2014-05-15 ENCOUNTER — Encounter (HOSPITAL_COMMUNITY): Payer: Self-pay | Admitting: Emergency Medicine

## 2015-12-26 ENCOUNTER — Other Ambulatory Visit: Payer: Self-pay | Admitting: Obstetrics and Gynecology

## 2015-12-26 DIAGNOSIS — N63 Unspecified lump in unspecified breast: Secondary | ICD-10-CM

## 2016-01-02 ENCOUNTER — Ambulatory Visit
Admission: RE | Admit: 2016-01-02 | Discharge: 2016-01-02 | Disposition: A | Discharge status: Home / Self Care | Payer: Commercial Managed Care - HMO | Source: Ambulatory Visit | Attending: Obstetrics and Gynecology | Admitting: Obstetrics and Gynecology

## 2016-01-02 ENCOUNTER — Other Ambulatory Visit: Payer: Self-pay | Admitting: Obstetrics and Gynecology

## 2016-01-02 DIAGNOSIS — N63 Unspecified lump in unspecified breast: Secondary | ICD-10-CM

## 2016-01-02 HISTORY — DX: Unspecified lump in unspecified breast: N63.0

## 2016-03-05 ENCOUNTER — Encounter: Payer: Self-pay | Admitting: Family Medicine

## 2016-03-05 ENCOUNTER — Ambulatory Visit (INDEPENDENT_AMBULATORY_CARE_PROVIDER_SITE_OTHER): Payer: Commercial Managed Care - HMO | Admitting: Family Medicine

## 2016-03-05 VITALS — BP 114/73 | HR 60 | Temp 98.6°F | Resp 20 | Ht 64.0 in | Wt 214.8 lb

## 2016-03-05 DIAGNOSIS — Z7689 Persons encountering health services in other specified circumstances: Secondary | ICD-10-CM

## 2016-03-05 DIAGNOSIS — Z6836 Body mass index (BMI) 36.0-36.9, adult: Secondary | ICD-10-CM

## 2016-03-05 DIAGNOSIS — E049 Nontoxic goiter, unspecified: Secondary | ICD-10-CM

## 2016-03-05 DIAGNOSIS — Z7189 Other specified counseling: Secondary | ICD-10-CM

## 2016-03-05 DIAGNOSIS — R5382 Chronic fatigue, unspecified: Secondary | ICD-10-CM

## 2016-03-05 DIAGNOSIS — R946 Abnormal results of thyroid function studies: Secondary | ICD-10-CM

## 2016-03-05 DIAGNOSIS — R635 Abnormal weight gain: Secondary | ICD-10-CM

## 2016-03-05 DIAGNOSIS — Z131 Encounter for screening for diabetes mellitus: Secondary | ICD-10-CM

## 2016-03-05 DIAGNOSIS — R7989 Other specified abnormal findings of blood chemistry: Secondary | ICD-10-CM

## 2016-03-05 DIAGNOSIS — F418 Other specified anxiety disorders: Secondary | ICD-10-CM

## 2016-03-05 LAB — COMPREHENSIVE METABOLIC PANEL
ALK PHOS: 62 U/L (ref 39–117)
ALT: 11 U/L (ref 0–35)
AST: 11 U/L (ref 0–37)
Albumin: 4.3 g/dL (ref 3.5–5.2)
BILIRUBIN TOTAL: 0.2 mg/dL (ref 0.2–1.2)
BUN: 15 mg/dL (ref 6–23)
CO2: 30 mEq/L (ref 19–32)
CREATININE: 0.83 mg/dL (ref 0.40–1.20)
Calcium: 9.4 mg/dL (ref 8.4–10.5)
Chloride: 101 mEq/L (ref 96–112)
GFR: 88.38 mL/min (ref >60.00)
Glucose, Bld: 79 mg/dL (ref 70–99)
Potassium: 4.7 mEq/L (ref 3.5–5.1)
Sodium: 138 mEq/L (ref 135–145)
Total Protein: 7.5 g/dL (ref 6.0–8.3)

## 2016-03-05 LAB — CBC WITH DIFFERENTIAL/PLATELET
Basophils Absolute: 0 10*3/uL (ref 0.0–0.1)
Basophils Relative: 0.4 % (ref 0.0–3.0)
EOS ABS: 0.1 10*3/uL (ref 0.0–0.7)
Eosinophils Relative: 1.2 % (ref 0.0–5.0)
HCT: 38 % (ref 36.0–46.0)
HEMOGLOBIN: 12.8 g/dL (ref 12.0–15.0)
Lymphocytes Relative: 28 % (ref 12.0–46.0)
Lymphs Abs: 2.6 10*3/uL (ref 0.7–4.0)
MCHC: 33.6 g/dL (ref 30.0–36.0)
MCV: 87.3 fl (ref 78.0–100.0)
Monocytes Absolute: 0.5 10*3/uL (ref 0.1–1.0)
Monocytes Relative: 4.9 % (ref 3.0–12.0)
Neutro Abs: 6.2 10*3/uL (ref 1.4–7.7)
Neutrophils Relative %: 65.5 % (ref 43.0–77.0)
Platelets: 271 10*3/uL (ref 150.0–400.0)
RBC: 4.35 Mil/uL (ref 3.87–5.11)
RDW: 13.4 % (ref 11.5–15.5)
WBC: 9.5 10*3/uL (ref 4.0–10.5)

## 2016-03-05 LAB — TSH: TSH: 3.11 u[IU]/mL (ref 0.35–4.50)

## 2016-03-05 LAB — HEMOGLOBIN A1C: HEMOGLOBIN A1C: 5.4 % (ref 4.6–6.5)

## 2016-03-05 MED ORDER — VENLAFAXINE HCL ER 37.5 MG PO CP24: 37.5000 mg | ORAL_CAPSULE | Freq: Every day | ORAL | 0 refills | Status: DC

## 2016-03-05 MED ORDER — VENLAFAXINE HCL ER 75 MG PO CP24: 75.0000 mg | ORAL_CAPSULE | Freq: Every day | ORAL | 0 refills | Status: DC

## 2016-03-05 NOTE — Patient Instructions (Signed)
It was a pleasure to meet you today. I will call you with labs and ultrasound result once available.  They will call you to set up ultrasound time.  Start effexor 37.5 mg for 7 days then start increased dose at 75 mg a day. Discontinue lexapro.  F/U 4 weeks.

## 2016-03-05 NOTE — Progress Notes (Signed)
Patient ID: Tara Gallagher, female  DOB: 1990-10-23, 25 y.o.   MRN: [id number] Patient Care Team    Relationship Specialty Notifications Start End  Ma Hillock, DO PCP - General Family Medicine  03/05/16   Sherlyn Hay, DO Consulting Physician Obstetrics and Gynecology  03/05/16     Subjective:  Tara Gallagher is a 25 y.o.  female present for new patient establishment. All past medical history, surgical history, allergies, family history, immunizations, medications and social history were obtained and entered in the electronic medical record today. All recent labs, ED visits and hospitalizations within the last year were reviewed.  Patient follows with her gynecologist yearly. Her last exam blood work for a thyroid panel was completed secondary to her complaining of gaining weight. She states she eats very small portions and you could not lose weight she was more agitated and feeling flushed. Tired panel was normal with the exception of T3 uptake was 19 (low). She also discovered a breast lump at that time and had a mammogram completed that was a BI-RADS Category 3, probably benign for masses in the lateral breast. Short interval repeat mammogram was recommended, patient is following with her gynecologist on this matter.   Depression with anxiety: Patient states She was started on lexapro about 1 year ago, she feels her anxiety is not controlled. She endorses depression symptoms as well. She feels she had some postpartum depression. Her husband is a IT trainer and gone from the home leaving her to care for her two children. She feels overwhelmed and alone. She states it is difficult for her to be around many people, her anxiety is worsened at that time. She is not planning to have additional children.   Health maintenance: Colonoscopy: no Fhx, screen 50 Mammogram: no fhx. Mammogram completed June 2017 for breast lump. BI-RADS Category 3. Following with gynecology. Short term  interval repeat mammogram advised. Cervical cancer screening: Dr. Terri Piedra 10/2015. Patient states she's had all normal Paps. Immunizations: tdap 2015, Influenza  (encouraged yearly) Infectious disease screening: HIV 2015   Immunization History  Administered Date(s) Administered  . Td 07/14/2013     Mood d/o screen: Negative   Depression screen PHQ 2/9 03/07/2016  Decreased Interest 3  Down, Depressed, Hopeless 3  PHQ - 2 Score 6  Altered sleeping 1  Tired, decreased energy 3  Change in appetite 1  Feeling bad or failure about yourself  3  Trouble concentrating 1  Moving slowly or fidgety/restless 2  Suicidal thoughts 0  PHQ-9 Score 17   GAD 7 : Generalized Anxiety Score 03/07/2016  Nervous, Anxious, on Edge 3  Control/stop worrying 3  Worry too much - different things 3  Trouble relaxing 3  Restless 3  Easily annoyed or irritable 3  Afraid - awful might happen 3  Total GAD 7 Score 21  Anxiety Difficulty Very difficult      Past Medical History:  Diagnosis Date  . Anemia affecting pregnancy   . Anxiety   . Breast mass 01/02/2016   Mammogram completed BI-RADS Category 3 probably benign short term interval follow-up suggested.  . Depression   . Ovarian cyst   . UTI (lower urinary tract infection) 3/15   No Known Allergies Past Surgical History:  Procedure Laterality Date  . INSERTION OF MESH N/A 02/01/2014   Procedure: INSERTION OF MESH;  Surgeon: Imogene Burn. Georgette Dover, MD;  Location: [place];  Service: General;  Laterality: N/A;  . PILONIDAL CYST  EXCISION  2002 and 2014  . UMBILICAL HERNIA REPAIR N/A 02/01/2014   Procedure: HERNIA REPAIR UMBILICAL ADULT WITH MESH;  Surgeon: Imogene Burn. Georgette Dover, MD;  Location: [place] [facility];  Service: General;  Laterality: N/A;  . WISDOM TOOTH EXTRACTION     Family History  Problem Relation Age of Onset  . Cancer Neg Hx   . Heart disease Neg Hx    Social History   Social History  . Marital status: Married    Spouse name: N/A  . Number of  children: N/A  . Years of education: N/A   Occupational History  . Not on file.   Social History Main Topics  . Smoking status: Never Smoker  . Smokeless tobacco: Never Used  . Alcohol use No  . Drug use: No  . Sexual activity: Yes    Partners: Male    Birth control/ protection: None     Comment: MArried   Other Topics Concern  . 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    Drinks caffeine.    Wears seatbelt, exercises routinely.    Smoke detector in the home.   Firearms in the home (locked).    Feels safe in relationship.      Medication List       Accurate as of 03/05/16 11:59 PM. Always use your most recent med list.          ORTHO TRI-CYCLEN LO 0.18/0.215/0.25 MG-25 MCG tab Generic drug:  Norgestimate-Ethinyl Estradiol Triphasic Take 1 tablet by mouth daily.   venlafaxine XR 37.5 MG 24 hr capsule Commonly known as:  EFFEXOR XR Take 1 capsule (37.5 mg total) by mouth daily with breakfast.   venlafaxine XR 75 MG 24 hr capsule Commonly known as:  EFFEXOR XR Take 1 capsule (75 mg total) by mouth daily with breakfast.        Recent Results (from the past 2160 hour(s))  CBC w/Diff     Status: None   Collection Time: 03/05/16  1:42 PM  Result Value Ref Range   WBC 9.5 4.0 - 10.5 K/uL   RBC 4.35 3.87 - 5.11 Mil/uL   Hemoglobin 12.8 12.0 - 15.0 g/dL   HCT 38.0 36.0 - 46.0 %   MCV 87.3 78.0 - 100.0 fl   MCHC 33.6 30.0 - 36.0 g/dL   RDW 13.4 11.5 - 15.5 %   Platelets 271.0 150.0 - 400.0 K/uL   Neutrophils Relative % 65.5 43.0 - 77.0 %   Lymphocytes Relative 28.0 12.0 - 46.0 %   Monocytes Relative 4.9 3.0 - 12.0 %   Eosinophils Relative 1.2 0.0 - 5.0 %   Basophils Relative 0.4 0.0 - 3.0 %   Neutro Abs 6.2 1.4 - 7.7 K/uL   Lymphs Abs 2.6 0.7 - 4.0 K/uL   Monocytes Absolute 0.5 0.1 - 1.0 K/uL   Eosinophils Absolute 0.1 0.0 - 0.7 K/uL   Basophils Absolute 0.0 0.0 - 0.1 K/uL    HgB A1c     Status: None   Collection Time: 03/05/16  1:42 PM  Result Value Ref Range   Hgb A1c MFr Bld 5.4 4.6 - 6.5 %    Comment: Glycemic Control Guidelines for People with Diabetes:Non Diabetic:  <6%Goal of Therapy: <7%Additional Action Suggested:  >8%   TSH     Status: None   Collection Time: 03/05/16  1:42 PM  Result Value Ref  Range   TSH 3.11 0.35 - 4.50 uIU/mL  Comp Met (CMET)     Status: None   Collection Time: 03/05/16  1:42 PM  Result Value Ref Range   Sodium 138 135 - 145 mEq/L   Potassium 4.7 3.5 - 5.1 mEq/L   Chloride 101 96 - 112 mEq/L   CO2 30 19 - 32 mEq/L   Glucose, Bld 79 70 - 99 mg/dL   BUN 15 6 - 23 mg/dL   Creatinine, Ser 0.83 0.40 - 1.20 mg/dL   Total Bilirubin 0.2 0.2 - 1.2 mg/dL   Alkaline Phosphatase 62 39 - 117 U/L   AST 11 0 - 37 U/L   ALT 11 0 - 35 U/L   Total Protein 7.5 6.0 - 8.3 g/dL   Albumin 4.3 3.5 - 5.2 g/dL   Calcium 9.4 8.4 - 10.5 mg/dL   GFR 88.38 >60.00 mL/min    US Breast Ltd Uni Left Inc Axilla  Result Date: 01/02/2016 CLINICAL DATA:  Patient complains of bilateral palpable abnormalities. Patient stopped breastfeeding approximately 1 year ago. EXAM: ULTRASOUND OF THE BILATERAL BREAST COMPARISON:  None. FINDINGS: On physical exam, I palpate a pea-sized nodule in the left breast at 8 o'clock 3 cm from the nipple. I palpate a discrete area of thickening in the right breast at 6 o'clock 2 cm from the nipple. Targeted ultrasound is performed, showing there is a well-circumscribed hyperechoic lesion with a thin hypoechoic rim measuring 8 x 5 x 9 mm in the left breast at 8 o'clock 3 cm from the nipple. There is a similar appearing lesion in the right breast at 6 o'clock 2 cm from the nipple measuring 1.3 x 0.8 x 1.1 cm. The lesions have a benign appearance. These may be galactoceles or fat necrosis. IMPRESSION: Probable benign lesion in each breast. RECOMMENDATION: Short-term interval followup ultrasound in 6 months is recommended. I have discussed  the findings and recommendations with the patient. Results were also provided in writing at the conclusion of the visit. If applicable, a reminder letter will be sent to the patient regarding the next appointment. BI-RADS CATEGORY  3: Probably benign finding(s) - short interval follow-up suggested. Electronically Signed   By: Lillia Mountain M.D.   On: 01/02/2016 13:38   US Breast Ltd Uni Right Inc Axilla  Result Date: 01/02/2016 CLINICAL DATA:  Patient complains of bilateral palpable abnormalities. Patient stopped breastfeeding approximately 1 year ago. EXAM: ULTRASOUND OF THE BILATERAL BREAST COMPARISON:  None. FINDINGS: On physical exam, I palpate a pea-sized nodule in the left breast at 8 o'clock 3 cm from the nipple. I palpate a discrete area of thickening in the right breast at 6 o'clock 2 cm from the nipple. Targeted ultrasound is performed, showing there is a well-circumscribed hyperechoic lesion with a thin hypoechoic rim measuring 8 x 5 x 9 mm in the left breast at 8 o'clock 3 cm from the nipple. There is a similar appearing lesion in the right breast at 6 o'clock 2 cm from the nipple measuring 1.3 x 0.8 x 1.1 cm. The lesions have a benign appearance. These may be galactoceles or fat necrosis. IMPRESSION: Probable benign lesion in each breast. RECOMMENDATION: Short-term interval followup ultrasound in 6 months is recommended. I have discussed the findings and recommendations with the patient. Results were also provided in writing at the conclusion of the visit. If applicable, a reminder letter will be sent to the patient regarding the next appointment. BI-RADS CATEGORY  3: Probably benign finding(s) -  short interval follow-up suggested. Electronically Signed   By: Lillia Mountain M.D.   On: 01/02/2016 13:38     ROS: 14 pt review of systems performed and negative (unless mentioned in an HPI)  Objective: BP 114/73 (BP Location: Right Arm, Patient Position: Sitting, Cuff Size: Large)   Pulse 60   Temp  98.6 F (37 C) (Oral)   Resp 20   Ht _0  (1.626 m)   Wt 214 lb 12 oz (97.4 kg)   LMP 01/28/2016   SpO2 97%   BMI 36.86 kg/m  Gen: Afebrile. No acute distress. Nontoxic in appearance, well-developed, well-nourished,  pleasant Caucasian female. Tearful. HENT: AT. [place]. Bilateral TM visualized and normal in appearance, normal external auditory canal. MMM, no oral lesions, adequate dentition. Bilateral nares within normal limits. Throat without erythema, ulcerations or exudates. No Cough on exam, no hoarseness on exam. Eyes:Pupils Equal Round Reactive to light, Extraocular movements intact,  Conjunctiva without redness, discharge or icterus. Neck/lymp/endocrine: Supple, no lymphadenopathy, mild thyromegaly CV: RRR no murmur appreciated, no edema, +2/4 P posterior tibialis pulses.  Chest: CTAB, no wheeze, rhonchi or crackles.  Abd: Soft. NTND. BS present.  Skin: Warm and well-perfused. Skin intact. Neuro/Msk:  Normal gait. PERLA. EOMi. Alert. Oriented x3.   Psych: Crying, tearful. Mildly anxious. Normal dress and demeanor. Normal speech. Normal thought content and judgment.   Assessment/plan: LEONTINE RADMAN is a 25 y.o. female present for establishment of care with new provider. Abnormal thyroid blood test/abnormal thyroid exam/weight gain - Abnormal T3, and mildly enlarged thyroid on exam - TSH - US Soft Tissue Head/Neck; Future  Diabetes mellitus screening/BMI 36.0-36.9 - HgB A1c - Refill he discussed diet and exercise modifications, will discuss in more detail on follow-up appointment in 4 weeks   Chronic fatigue - Discussed with patient this could partially be due to her thyroid versus depression - CBC w/Diff - HgB A1c - TSH - Comp Met (CMET)  Depression with anxiety - Discontinue Lexapro - Start Effexor taper to 75 mg. - Follow-up in 4 weeks  Return in about 4 weeks (around 04/02/2016), or anxiety.   Greater than 45 minutes was spent with patient, greater than 50% of that  time was spent face-to-face with patient counseling and coordinating care.  Electronically signed by: Howard Pouch, DO [place]

## 2016-03-06 ENCOUNTER — Telehealth: Payer: Self-pay | Admitting: Family Medicine

## 2016-03-06 NOTE — Telephone Encounter (Signed)
Please call pt: - her labs are normal.  - we will call with her US report once available.

## 2016-03-06 NOTE — Telephone Encounter (Signed)
Spoke with patient reviewed lab results. 

## 2016-03-07 ENCOUNTER — Encounter: Payer: Self-pay | Admitting: Family Medicine

## 2016-03-07 DIAGNOSIS — R946 Abnormal results of thyroid function studies: Secondary | ICD-10-CM | POA: Insufficient documentation

## 2016-03-07 DIAGNOSIS — R7989 Other specified abnormal findings of blood chemistry: Secondary | ICD-10-CM | POA: Insufficient documentation

## 2016-03-07 DIAGNOSIS — R5382 Chronic fatigue, unspecified: Secondary | ICD-10-CM | POA: Insufficient documentation

## 2016-03-07 DIAGNOSIS — R635 Abnormal weight gain: Secondary | ICD-10-CM | POA: Insufficient documentation

## 2016-03-07 DIAGNOSIS — F418 Other specified anxiety disorders: Secondary | ICD-10-CM | POA: Insufficient documentation

## 2016-03-07 DIAGNOSIS — E049 Nontoxic goiter, unspecified: Secondary | ICD-10-CM | POA: Insufficient documentation

## 2016-03-07 DIAGNOSIS — E669 Obesity, unspecified: Secondary | ICD-10-CM | POA: Insufficient documentation

## 2016-03-28 ENCOUNTER — Ambulatory Visit (HOSPITAL_BASED_OUTPATIENT_CLINIC_OR_DEPARTMENT_OTHER)
Admission: RE | Admit: 2016-03-28 | Discharge: 2016-03-28 | Disposition: A | Discharge status: Home / Self Care | Payer: Commercial Managed Care - HMO | Source: Ambulatory Visit | Attending: Family Medicine | Admitting: Family Medicine

## 2016-03-28 DIAGNOSIS — E049 Nontoxic goiter, unspecified: Secondary | ICD-10-CM | POA: Insufficient documentation

## 2016-03-28 DIAGNOSIS — R946 Abnormal results of thyroid function studies: Secondary | ICD-10-CM | POA: Diagnosis not present

## 2016-03-28 DIAGNOSIS — R7989 Other specified abnormal findings of blood chemistry: Secondary | ICD-10-CM

## 2016-03-31 ENCOUNTER — Telehealth: Payer: Self-pay | Admitting: Family Medicine

## 2016-03-31 NOTE — Telephone Encounter (Signed)
Please call pt: - her thyroid US is normal.

## 2016-03-31 NOTE — Telephone Encounter (Signed)
Left message with US results on patient voice mail [facility]

## 2016-04-02 ENCOUNTER — Ambulatory Visit: Payer: Commercial Managed Care - HMO | Admitting: Family Medicine

## 2016-04-03 ENCOUNTER — Encounter: Payer: Self-pay | Admitting: Family Medicine

## 2016-04-03 ENCOUNTER — Ambulatory Visit (INDEPENDENT_AMBULATORY_CARE_PROVIDER_SITE_OTHER): Payer: Commercial Managed Care - HMO | Admitting: Family Medicine

## 2016-04-03 VITALS — BP 97/64 | HR 69 | Temp 98.5°F | Resp 20 | Ht 64.0 in | Wt 214.5 lb

## 2016-04-03 DIAGNOSIS — F418 Other specified anxiety disorders: Secondary | ICD-10-CM | POA: Diagnosis not present

## 2016-04-03 MED ORDER — VENLAFAXINE HCL ER 75 MG PO CP24: 75.0000 mg | ORAL_CAPSULE | Freq: Every day | ORAL | 1 refills | Status: DC

## 2016-04-03 NOTE — Patient Instructions (Signed)
Continue 75 mg daily. I have called in refills for 90 days and additional refill.  Follow in 3 months, if still doing well then will then see you  every 6 months.  Glad you are felling better.

## 2016-04-03 NOTE — Progress Notes (Signed)
Patient ID: Tara Gallagher, female  DOB: Apr 09, 1991, 25 y.o.   MRN: [id number] Patient Care Team    Relationship Specialty Notifications Start End  Ma Hillock, DO PCP - General Family Medicine  03/05/16   Sherlyn Hay, DO Consulting Physician Obstetrics and Gynecology  03/05/16     Subjective:  Tara Gallagher is a 25 y.o.  female present for follow up on anxiety and depression  Depression with anxiety: Pt presents for followup on depression and anxiety, after starting medications 4 weeks ago. She was started on effexor taper and lexapro was discontinued. She reports she is feeling "MUCH" better. She has more energy, and does not feel overwhelmed or alone any longer. She has not tried to go around groups of peoplel as of yet. She reports she has an event for her son's school this Friday. She denies any side effects from the medication. She did go to her mothers home to visit and reports feeling really anxious for a moment, but it passed without an attack.   Prior note.  Patient states She was started on lexapro about 1 year ago, she feels her anxiety is not controlled. She endorses depression symptoms as well. She feels she had some postpartum depression. Her husband is a IT trainer and gone from the home leaving her to care for her two children. She feels overwhelmed and alone. She states it is difficult for her to be around many people, her anxiety is worsened at that time. She is not planning to have additional children.    Immunization History  Administered Date(s) Administered  . Td 07/14/2013     Mood d/o screen: Negative   Depression screen PHQ 2/9 03/07/2016  Decreased Interest 3  Down, Depressed, Hopeless 3  PHQ - 2 Score 6  Altered sleeping 1  Tired, decreased energy 3  Change in appetite 1  Feeling bad or failure about yourself  3  Trouble concentrating 1  Moving slowly or fidgety/restless 2  Suicidal thoughts 0  PHQ-9 Score 17   GAD 7 : Generalized  Anxiety Score 03/07/2016  Nervous, Anxious, on Edge 3  Control/stop worrying 3  Worry too much - different things 3  Trouble relaxing 3  Restless 3  Easily annoyed or irritable 3  Afraid - awful might happen 3  Total GAD 7 Score 21  Anxiety Difficulty Very difficult      Past Medical History:  Diagnosis Date  . Anemia affecting pregnancy   . Anxiety   . Breast mass 01/02/2016   Mammogram completed BI-RADS Category 3 probably benign short term interval follow-up suggested.  . Depression   . Ovarian cyst   . Pyelonephritis affecting pregnancy in third trimester   . UTI (lower urinary tract infection) 3/15   No Known Allergies Past Surgical History:  Procedure Laterality Date  . INSERTION OF MESH N/A 02/01/2014   Procedure: INSERTION OF MESH;  Surgeon: Imogene Burn. Georgette Dover, MD;  Location: [facility];  Service: General;  Laterality: N/A;  . PILONIDAL CYST EXCISION  2002 and 2014  . UMBILICAL HERNIA REPAIR N/A 02/01/2014   Procedure: HERNIA REPAIR UMBILICAL ADULT WITH MESH;  Surgeon: Imogene Burn. Georgette Dover, MD;  Location: [facility];  Service: General;  Laterality: N/A;  . WISDOM TOOTH EXTRACTION     Family History  Problem Relation Age of Onset  . Cancer Neg Hx   . Heart disease Neg Hx    Social History   Social History  .  Marital status: Married    Spouse name: N/A  . Number of children: N/A  . Years of education: N/A   Occupational History  . Not on file.   Social History Main Topics  . Smoking status: Never Smoker  . Smokeless tobacco: Never Used  . Alcohol use No  . Drug use: No  . Sexual activity: Yes    Partners: Male    Birth control/ protection: None     Comment: MArried   Other Topics Concern  . 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    Drinks caffeine.    Wears seatbelt, exercises routinely.    Smoke detector in the home.   Firearms in the home (locked).    Feels safe in  relationship.      Medication List       Accurate as of 04/03/16 11:21 AM. Always use your most recent med list.          ORTHO TRI-CYCLEN LO 0.18/0.215/0.25 MG-25 MCG tab Generic drug:  Norgestimate-Ethinyl Estradiol Triphasic Take 1 tablet by mouth daily.   venlafaxine XR 75 MG 24 hr capsule Commonly known as:  EFFEXOR XR Take 1 capsule (75 mg total) by mouth daily with breakfast.        Recent Results (from the past 2160 hour(s))  CBC w/Diff     Status: None   Collection Time: 03/05/16  1:42 PM  Result Value Ref Range   WBC 9.5 4.0 - 10.5 K/uL   RBC 4.35 3.87 - 5.11 Mil/uL   Hemoglobin 12.8 12.0 - 15.0 g/dL   HCT 38.0 36.0 - 46.0 %   MCV 87.3 78.0 - 100.0 fl   MCHC 33.6 30.0 - 36.0 g/dL   RDW 13.4 11.5 - 15.5 %   Platelets 271.0 150.0 - 400.0 K/uL   Neutrophils Relative % 65.5 43.0 - 77.0 %   Lymphocytes Relative 28.0 12.0 - 46.0 %   Monocytes Relative 4.9 3.0 - 12.0 %   Eosinophils Relative 1.2 0.0 - 5.0 %   Basophils Relative 0.4 0.0 - 3.0 %   Neutro Abs 6.2 1.4 - 7.7 K/uL   Lymphs Abs 2.6 0.7 - 4.0 K/uL   Monocytes Absolute 0.5 0.1 - 1.0 K/uL   Eosinophils Absolute 0.1 0.0 - 0.7 K/uL   Basophils Absolute 0.0 0.0 - 0.1 K/uL  HgB A1c     Status: None   Collection Time: 03/05/16  1:42 PM  Result Value Ref Range   Hgb A1c MFr Bld 5.4 4.6 - 6.5 %    Comment: Glycemic Control Guidelines for People with Diabetes:Non Diabetic:  <6%Goal of Therapy: <7%Additional Action Suggested:  >8%   TSH     Status: None   Collection Time: 03/05/16  1:42 PM  Result Value Ref Range   TSH 3.11 0.35 - 4.50 uIU/mL  Comp Met (CMET)     Status: None   Collection Time: 03/05/16  1:42 PM  Result Value Ref Range   Sodium 138 135 - 145 mEq/L   Potassium 4.7 3.5 - 5.1 mEq/L   Chloride 101 96 - 112 mEq/L   CO2 30 19 - 32 mEq/L   Glucose, Bld 79 70 - 99 mg/dL   BUN 15 6 - 23 mg/dL   Creatinine, Ser 0.83 0.40 - 1.20 mg/dL   Total Bilirubin 0.2 0.2 - 1.2 mg/dL   Alkaline Phosphatase  62  39 - 117 U/L   AST 11 0 - 37 U/L   ALT 11 0 - 35 U/L   Total Protein 7.5 6.0 - 8.3 g/dL   Albumin 4.3 3.5 - 5.2 g/dL   Calcium 9.4 8.4 - 10.5 mg/dL   GFR 88.38 >60.00 mL/min     ROS: 14 pt review of systems performed and negative (unless mentioned in an HPI)  Objective: BP 97/64 (BP Location: Right Arm, Patient Position: Sitting, Cuff Size: Large)   Pulse 69   Temp 98.5 F (36.9 C)   Resp 20   Ht _0  (1.626 m)   Wt 214 lb 8 oz (97.3 kg)   LMP 03/13/2016   SpO2 98%   BMI 36.82 kg/m  Gen: Afebrile. No acute distress. Nontoxic in appearance, well-developed, well-nourished,  pleasant Caucasian female. Happy.  HENT: AT. [place]. MMM Eyes:Pupils Equal Round Reactive to light, Extraocular movements intact,  Conjunctiva without redness, discharge or icterus.   Psych: happy, smiling.  Normal dress and demeanor. Normal speech. Normal thought content and judgment.   Assessment/plan: MACKENA PLUMMER is a 25 y.o. female present for follow up Depression with anxiety - Continue Effexor  75 mg QD. - refills provided today.  - Follow-up in 3 months, if still doing well then 6  Months.   Return in about 3 months (around 07/03/2016), or depression/anxiety.   Electronically signed by: Howard Pouch, DO [place]

## 2016-10-06 ENCOUNTER — Other Ambulatory Visit: Payer: Self-pay | Admitting: *Deleted

## 2016-10-06 MED ORDER — VENLAFAXINE HCL ER 75 MG PO CP24: 75.0000 mg | ORAL_CAPSULE | Freq: Every day | ORAL | 0 refills | Status: DC

## 2016-10-06 NOTE — Telephone Encounter (Signed)
Refill authorized for 30 day supply patient needs office visit prior to anymore refills.

## 2016-11-03 ENCOUNTER — Other Ambulatory Visit: Payer: Self-pay | Admitting: Family Medicine

## 2016-11-03 NOTE — Telephone Encounter (Signed)
Dr. Kuneff pt.  

## 2016-11-04 NOTE — Telephone Encounter (Signed)
Left message for patient 14 day supply of venlafaxine sent to patient pharmacy. Patient needs appt prior to anymore refills. Requested patient to call our office to schedule an appt.

## 2016-11-06 ENCOUNTER — Encounter: Payer: Self-pay | Admitting: Family Medicine

## 2016-11-06 ENCOUNTER — Ambulatory Visit (INDEPENDENT_AMBULATORY_CARE_PROVIDER_SITE_OTHER): Payer: Commercial Managed Care - HMO | Admitting: Family Medicine

## 2016-11-06 VITALS — BP 103/67 | HR 65 | Temp 98.2°F | Resp 20 | Wt 185.8 lb

## 2016-11-06 DIAGNOSIS — F418 Other specified anxiety disorders: Secondary | ICD-10-CM | POA: Diagnosis not present

## 2016-11-06 DIAGNOSIS — Z6831 Body mass index (BMI) 31.0-31.9, adult: Secondary | ICD-10-CM

## 2016-11-06 MED ORDER — VENLAFAXINE HCL ER 75 MG PO CP24: ORAL_CAPSULE | ORAL | 1 refills | Status: DC

## 2016-11-06 NOTE — Progress Notes (Signed)
Patient ID: Tara Gallagher, female  DOB: 23-Dec-1990, 26 y.o.   MRN: [id number] Patient Care Team    Relationship Specialty Notifications Start End  Natalia Leatherwood, DO PCP - General Family Medicine  03/05/16   Edwinna Areola, DO Consulting Physician Obstetrics and Gynecology  03/05/16     Subjective:  Tara Gallagher is a 26 y.o.  female present for follow up on anxiety and depression  Depression with anxiety:  She is doing well on medication of Effexor 75 mg. She is starting to feel she is able to go out more around people. Her son is playing baseball and she is able to go to games without panic. She endorses a few bad days in a row and she considered increasing dose, but feels she did ok and it passed. She also started a new job, with some anxiety, but is doing well.   Pt presents for followup on depression and anxiety, after starting medications 4 weeks ago. She was started on effexor taper and lexapro was discontinued. She reports she is feeling "MUCH" better. She has more energy, and does not feel overwhelmed or alone any longer. She has not tried to go around groups of people as of yet. She reports she has an event for her son's school this Friday. She denies any side effects from the medication. She did go to her mothers home to visit and reports feeling really anxious for a moment, but it passed without an attack.   Prior note.  Patient states She was started on lexapro about 1 year ago, she feels her anxiety is not controlled. She endorses depression symptoms as well. She feels she had some postpartum depression. Her husband is a Theatre stage manager and gone from the home leaving her to care for her two children. She feels overwhelmed and alone. She states it is difficult for her to be around many people, her anxiety is worsened at that time. She is not planning to have additional children.    Immunization History  Administered Date(s) Administered  . Td 07/14/2013     Mood d/o  screen: Negative 04/03/2016  Depression screen [facility] 2/9 11/06/2016 03/07/2016  Decreased Interest 0 3  Down, Depressed, Hopeless 0 3  PHQ - 2 Score 0 6  Altered sleeping - 1  Tired, decreased energy - 3  Change in appetite - 1  Feeling bad or failure about yourself  - 3  Trouble concentrating - 1  Moving slowly or fidgety/restless - 2  Suicidal thoughts - 0  PHQ-9 Score - 17   GAD 7 : Generalized Anxiety Score 03/07/2016  Nervous, Anxious, on Edge 3  Control/stop worrying 3  Worry too much - different things 3  Trouble relaxing 3  Restless 3  Easily annoyed or irritable 3  Afraid - awful might happen 3  Total GAD 7 Score 21  Anxiety Difficulty Very difficult      Past Medical History:  Diagnosis Date  . Anemia affecting pregnancy   . Anxiety   . Breast mass 01/02/2016   Mammogram completed BI-RADS Category 3 probably benign short term interval follow-up suggested.  . Depression   . Ovarian cyst   . Pyelonephritis affecting pregnancy in third trimester   . UTI (lower urinary tract infection) 3/15   No Known Allergies Past Surgical History:  Procedure Laterality Date  . INSERTION OF MESH N/A 02/01/2014   Procedure: INSERTION OF MESH;  Surgeon: Wilmon Arms. Corliss Skains, MD;  Location:  [facility];  Service: General;  Laterality: N/A;  . PILONIDAL CYST EXCISION  2002 and 2014  . UMBILICAL HERNIA REPAIR N/A 02/01/2014   Procedure: HERNIA REPAIR UMBILICAL ADULT WITH MESH;  Surgeon: Wilmon Arms. Corliss Skains, MD;  Location: [facility];  Service: General;  Laterality: N/A;  . WISDOM TOOTH EXTRACTION     Family History  Problem Relation Age of Onset  . Cancer Neg Hx   . Heart disease Neg Hx    Social History   Social History  . Marital status: Married    Spouse name: N/A  . Number of children: N/A  . Years of education: N/A   Occupational History  . Not on file.   Social History Main Topics  . Smoking status: Never Smoker  . Smokeless tobacco: Never Used  . Alcohol use No  . Drug use: No    . Sexual activity: Yes    Partners: Male    Birth control/ protection: None     Comment: MArried   Other Topics Concern  . 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    Drinks caffeine.    Wears seatbelt, exercises routinely.    Smoke detector in the home.   Firearms in the home (locked).    Feels safe in relationship.    Allergies as of 11/06/2016   No Known Allergies     Medication List       Accurate as of 11/06/16  8:22 AM. Always use your most recent med list.          ORTHO TRI-CYCLEN LO 0.18/0.215/0.25 MG-25 MCG tab Generic drug:  Norgestimate-Ethinyl Estradiol Triphasic Take 1 tablet by mouth daily.   venlafaxine XR 75 MG 24 hr capsule Commonly known as:  EFFEXOR-XR TAKE 1 CAPSULE EVERY DAY WITH BREAKFAST. NEED OFFICE VISIT PRIOR TO MORE REFILLS        No results found for this or any previous visit (from the past 2160 hour(s)).   ROS: 14 pt review of systems performed and negative (unless mentioned in an HPI)  Objective: BP 103/67 (BP Location: Right Arm, Patient Position: Sitting, Cuff Size: Large)   Pulse 65   Temp 98.2 F (36.8 C)   Resp 20   Wt 185 lb 12 oz (84.3 kg)   LMP 10/27/2016   SpO2 98%   BMI 31.88 kg/m  Gen: Afebrile. No acute distress.  HENT: AT. [place]. MMM Psych: Normal affect, dress and demeanor. Normal speech. Normal thought content and judgment.  Assessment/plan: [name] is a 26 y.o. female present for follow up Depression with anxiety BMI 31 - Stable today, doing well.  - Continue Effexor  75 mg QD, refills provided today.  - she is exercising and loosing weight.  - Follow-up in 6  Months, sooner if needed.  Return in about 6 months (around 05/08/2017) for depression/anxiety.   Electronically signed by: Felix Pacini, DO [facility] [place]

## 2016-11-06 NOTE — Patient Instructions (Signed)
I am so glad you are doing well. It was great seeing you. You are looking great! Keep up the good work!    Follow up in 6 months on depression/anxiety, sooner if needed.    Stay updated on physicals.    Please help [place] help you:  We are honored you have chosen [facility] for your Primary Care home. Below you will find basic instructions that you may need to access in the future. Please help [place] help you by reading the instructions, which cover many of the frequent questions we experience.   Prescription refills and request:  -In order to allow more efficient response time, please call your pharmacy for all refills. They will forward the request electronically to [place]. This allows for the quickest possible response. Request left on a nurse line can take longer to refill, since these are checked as time allows between office patients and other phone calls.  - refill request can take up to 3-5 working days to complete.  - If request is sent electronically and request is appropiate, it is usually completed in 1-2 business days.  - all patients will need to be seen routinely for all chronic medical conditions requiring prescription medications (see follow-up below). If you are overdue for follow up on your condition, you will be asked to make an appointment and we will call in enough medication to cover you until your appointment (up to 30 days).  - all controlled substances will require a face to face visit to request/refill.  - if you desire your prescriptions to go through a new pharmacy, and have an active script at original pharmacy, you will need to call your pharmacy and have scripts transferred to new pharmacy. This is completed between the pharmacy locations and not by your provider.    Results: If any images or labs were ordered, it can take up to 1 week to get results depending on the test ordered and the lab/facility running and resulting the test. - Normal or stable results, which  do not need further discussion, may be released to your mychart immediately with attached note to you. A call may not be generated for normal results. Please make certain to sign up for mychart. If you have questions on how to activate your mychart you can call the front office.  - If your results need further discussion, our office will attempt to contact you via phone, and if unable to reach you after 2 attempts, we will release your abnormal result to your mychart with instructions.  - All results will be automatically released in mychart after 1 week.  - Your provider will provide you with explanation and instruction on all relevant material in your results. Please keep in mind, results and labs may appear confusing or abnormal to the untrained eye, but it does not mean they are actually abnormal for you personally. If you have any questions about your results that are not covered, or you desire more detailed explanation than what was provided, you should make an appointment with your provider to do so.   Our office handles many outgoing and incoming calls daily. If we have not contacted you within 1 week about your results, please check your mychart to see if there is a message first and if not, then contact our office.  In helping with this matter, you help decrease call volume, and therefore allow [place] to be able to respond to patients needs more efficiently.   Acute office  visits (sick visit):  An acute visit is intended for a new problem and are scheduled in shorter time slots to allow schedule openings for patients with new problems. This is the appropriate visit to discuss a new problem. In order to provide you with excellent quality medical care with proper time for you to explain your problem, have an exam and receive treatment with instructions, these appointments should be limited to one new problem per visit. If you experience a new problem, in which you desire to be addressed, please make an  acute office visit, we save openings on the schedule to accommodate you. Please do not save your new problem for any other type of visit, let us take care of it properly and quickly for you.   Follow up visits:  Depending on your condition(s) your provider will need to see you routinely in order to provide you with quality care and prescribe medication(s). Most chronic conditions (Example: hypertension, Diabetes, depression/anxiety... etc), require visits a couple times a year. Your provider will instruct you on proper follow up for your personal medical conditions and history. Please make certain to make follow up appointments for your condition as instructed. Failing to do so could result in lapse in your medication treatment/refills. If you request a refill, and are overdue to be seen on a condition, we will always provide you with a 30 day script (once) to allow you time to schedule.    Medicare wellness (well visit): - we have a wonderful Nurse Maudie Mercury), that will meet with you and provide you will yearly medicare wellness visits. These visits should occur yearly (can not be scheduled less than 1 calendar year apart) and cover preventive health, immunizations, advance directives and screenings you are entitled to yearly through your medicare benefits. Do not miss out on your entitled benefits, this is when medicare will pay for these benefits to be ordered for you.  These are strongly encouraged by your provider and is the appropriate type of visit to make certain you are up to date with all preventive health benefits. If you have not had your medicare wellness exam in the last 12 months, please make certain to schedule one by calling the office and schedule your medicare wellness with Maudie Mercury as soon as possible.   Yearly physical (well visit):  - Adults are recommended to be seen yearly for physicals. Check with your insurance and date of your last physical, most insurances require one calendar year  between physicals. Physicals include all preventive health topics, screenings, medical exam and labs that are appropriate for gender/age and history. You may have fasting labs needed at this visit. This is a well visit (not a sick visit), new problems should not be covered during this visit (see acute visit).  - Pediatric patients are seen more frequently when they are younger. Your provider will advise you on well child visit timing that is appropriate for your their age. - This is not a medicare wellness visit. Medicare wellness exams do not have an exam portion to the visit. Some medicare companies allow for a physical, some do not allow a yearly physical. If your medicare allows a yearly physical you can schedule the medicare wellness with our nurse Maudie Mercury and have your physical with your provider after, on the same day. Please check with insurance for your full benefits.   Late Policy/No Shows:  - all new patients should arrive 15-30 minutes earlier than appointment to allow [place] time  to  obtain  all personal demographics,  insurance information and for you to complete office paperwork. - All established patients should arrive 10-15 minutes earlier than appointment time to update all information and be checked in .  - In our best efforts to run on time, if you are late for your appointment you will be asked to either reschedule or if able, we will work you back into the schedule. There will be a wait time to work you back in the schedule,  depending on availability.  - If you are unable to make it to your appointment as scheduled, please call 24 hours ahead of time to allow [place] to fill the time slot with someone else who needs to be seen. If you do not cancel your appointment ahead of time, you may be charged a no show fee.

## 2017-05-06 ENCOUNTER — Ambulatory Visit: Payer: Commercial Managed Care - HMO | Admitting: Family Medicine

## 2017-05-21 ENCOUNTER — Encounter: Payer: Self-pay | Admitting: *Deleted

## 2017-05-21 ENCOUNTER — Other Ambulatory Visit: Payer: Self-pay | Admitting: *Deleted

## 2017-05-21 MED ORDER — VENLAFAXINE HCL ER 75 MG PO CP24: ORAL_CAPSULE | ORAL | 0 refills | Status: DC

## 2017-05-21 NOTE — Telephone Encounter (Signed)
Patient due for follow up for depression. 30 day supply of venlafaxine sent to local pharmacy. Patient needs office visit for any further refills. Sent patient a message in MY Chart.

## 2017-05-26 ENCOUNTER — Ambulatory Visit: Payer: 59 | Admitting: Family Medicine

## 2017-05-26 ENCOUNTER — Encounter: Payer: Self-pay | Admitting: Family Medicine

## 2017-05-26 VITALS — BP 113/74 | HR 64 | Temp 98.5°F | Resp 20 | Wt 203.8 lb

## 2017-05-26 DIAGNOSIS — F418 Other specified anxiety disorders: Secondary | ICD-10-CM

## 2017-05-26 DIAGNOSIS — E669 Obesity, unspecified: Secondary | ICD-10-CM

## 2017-05-26 MED ORDER — VENLAFAXINE HCL ER 150 MG PO CP24: ORAL_CAPSULE | ORAL | 1 refills | Status: DC
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
## 2017-05-26 NOTE — Patient Instructions (Signed)
We have increased your Effexor to 150 mg a day. Take 2 of the 75 mg until you run out, the new script just take 1 (it is 150 mg).   Follow up in 6 months, sooner if needed.

## 2017-09-21 NOTE — Progress Notes (Signed)
Called and spoke to KianaHannah, scheduler to inform Dr. Senaida Oresichardson to place orders in Epic.

## 2017-09-24 ENCOUNTER — Other Ambulatory Visit: Payer: Self-pay

## 2017-09-24 ENCOUNTER — Encounter (HOSPITAL_COMMUNITY)
Admission: RE | Admit: 2017-09-24 | Discharge: 2017-09-24 | Disposition: A | Discharge status: Home / Self Care | Payer: 59 | Source: Ambulatory Visit | Attending: Obstetrics and Gynecology | Admitting: Obstetrics and Gynecology

## 2017-09-24 ENCOUNTER — Encounter (HOSPITAL_COMMUNITY): Payer: Self-pay

## 2017-09-24 DIAGNOSIS — Z01818 Encounter for other preprocedural examination: Secondary | ICD-10-CM | POA: Insufficient documentation

## 2017-09-24 HISTORY — DX: Nausea with vomiting, unspecified: R11.2

## 2017-09-24 HISTORY — DX: Other specified postprocedural states: Z98.890

## 2017-09-24 LAB — CBC
HEMATOCRIT: 37.5 % (ref 36.0–46.0)
HEMOGLOBIN: 12.2 g/dL (ref 12.0–15.0)
MCH: 29.3 pg (ref 26.0–34.0)
MCHC: 32.5 g/dL (ref 30.0–36.0)
MCV: 90.1 fL (ref 78.0–100.0)
Platelets: 257 10*3/uL (ref 150–400)
RBC: 4.16 MIL/uL (ref 3.87–5.11)
RDW: 12.7 % (ref 11.5–15.5)
WBC: 7.2 10*3/uL (ref 4.0–10.5)

## 2017-09-24 LAB — HCG, SERUM, QUALITATIVE: Preg, Serum: NEGATIVE

## 2017-09-24 LAB — BASIC METABOLIC PANEL
Anion gap: 9 (ref 5–15)
BUN: 14 mg/dL (ref 6–20)
CHLORIDE: 104 mmol/L (ref 101–111)
CO2: 26 mmol/L (ref 22–32)
Calcium: 9 mg/dL (ref 8.9–10.3)
Creatinine, Ser: 0.76 mg/dL (ref 0.44–1.00)
GFR calc Af Amer: >60 mL/min (ref >60)
Glucose, Bld: 95 mg/dL (ref 65–99)
POTASSIUM: 4.6 mmol/L (ref 3.5–5.1)
SODIUM: 139 mmol/L (ref 135–145)

## 2017-09-24 NOTE — Patient Instructions (Addendum)
Tara Gallagher  09/24/2017   Your procedure is scheduled on: Thursday 10/08/2017   [facility]   Report to admitting at  0600 AM    Call this number if you have problems the morning of surgery [phone]    Remember: Do not eat food or drink liquids :After Midnight.     Take these medicines the morning of surgery with A SIP OF WATER: Venlafaxine XR( Effexor-XR)                                You may not have any metal on your body including hair pins and              piercings  Do not wear jewelry, make-up, lotions, powders or perfumes, deodorant             Do not wear nail polish.  Do not shave  48 hours prior to surgery.              Men may shave face and neck.   Do not bring valuables to the hospital. [facility] IS NOT             RESPONSIBLE   FOR VALUABLES.  Contacts, dentures or bridgework may not be worn into surgery.  Leave suitcase in the car. After surgery it may be brought to your room.                  Please read over the following fact sheets you were given: _____________________________________________________________________             [facility] - Preparing [facility] Before surgery, you can play an important role.  Because skin is not sterile, your skin needs to be as free of germs as possible.  You can reduce the number of germs on your skin by washing with CHG (chlorahexidine gluconate) soap before surgery.  CHG is an antiseptic cleaner which kills germs and bonds with the skin to continue killing germs even after washing. Please DO NOT use if you have an allergy to CHG or antibacterial soaps.  If your skin becomes reddened/irritated stop using the CHG and inform your nurse when you arrive at Short Stay. Do not shave (including legs and underarms) for at least 48 hours prior to the first CHG shower.  You may shave your face/neck. Please follow these instructions carefully:  1.  Shower with CHG Soap  the night before surgery and the  morning of Surgery.  2.  If you choose to wash your hair, wash your hair first as usual with your  normal  shampoo.  3.  After you shampoo, rinse your hair and body thoroughly to remove the  shampoo.                           4.  Use CHG as you would any other liquid soap.  You can apply chg directly  to the skin and wash                       Gently with a scrungie or clean washcloth.  5.  Apply the CHG Soap to your body ONLY FROM THE NECK DOWN.   Do not use on face/ open  Wound or open sores. Avoid contact with eyes, ears mouth and genitals (private parts).                       Wash face,  Genitals (private parts) with your normal soap.             6.  Wash thoroughly, paying special attention to the area where your surgery  will be performed.  7.  Thoroughly rinse your body with warm water from the neck down.  8.  DO NOT shower/wash with your normal soap after using and rinsing off  the CHG Soap.                9.  Pat yourself dry with a clean towel.            10.  Wear clean pajamas.            11.  Place clean sheets on your bed the night of your first shower and do not  sleep with pets. Day of Surgery : Do not apply any lotions/deodorants the morning of surgery.  Please wear clean clothes to the hospital/surgery center.  FAILURE TO FOLLOW THESE INSTRUCTIONS MAY RESULT IN THE CANCELLATION OF YOUR SURGERY PATIENT SIGNATURE_________________________________  NURSE SIGNATURE__________________________________

## 2017-09-29 ENCOUNTER — Other Ambulatory Visit: Payer: Self-pay | Admitting: *Deleted

## 2017-09-29 MED ORDER — VENLAFAXINE HCL ER 150 MG PO CP24: ORAL_CAPSULE | ORAL | 0 refills | Status: DC

## 2017-10-07 NOTE — H&P (Signed)
Tara Gallagher is an 27 y.o. female G2P2 presenting for scheduled laparoscopic assisted hysterecomy and bilateral salpingectomies.  Pt reports she has had increasingly painful periods, starts week prior to menses then week of since delivery of last baby over 3 yearsago. It was intermittent but now has become every single month. Some months are worse than others, on the bad ones she cannot function and has to take meds and "feels like ripping insides out" Ibuprofen is not helpful, uses heat and rest.  She has been on OCP's but does not control the pain and still has long cycles of 10 days.  An EMB 8/18 was WNL. US 8/18 was unremarkable  Pertinent Gynecological History: OB History: NSVD x 2   Menstrual History:  No LMP recorded.    Past Medical History:  Diagnosis Date  . Anemia affecting pregnancy   . Anxiety   . Breast mass 01/02/2016   Mammogram completed BI-RADS Category 3 probably benign short term interval follow-up suggested.  . Depression   . Ovarian cyst   . PONV (postoperative nausea and vomiting)   . Pyelonephritis affecting pregnancy in third trimester   . UTI (lower urinary tract infection) 3/15    Past Surgical History:  Procedure Laterality Date  . BREAST SURGERY     bilateral breast biopsies- benign  . DILATION AND CURETTAGE OF UTERUS    . HERNIA REPAIR    . INSERTION OF MESH N/A 02/01/2014   Procedure: INSERTION OF MESH;  Surgeon: Wilmon ArmsMatthew K. Corliss Skainssuei, MD;  Location: [facility] OR;  Service: General;  Laterality: N/A;  . PILONIDAL CYST EXCISION  2002 and 2014  . UMBILICAL HERNIA REPAIR N/A 02/01/2014   Procedure: HERNIA REPAIR UMBILICAL ADULT WITH MESH;  Surgeon: Wilmon ArmsMatthew K. Corliss Skainssuei, MD;  Location: [facility] OR;  Service: General;  Laterality: N/A;  . WISDOM TOOTH EXTRACTION      Family History  Problem Relation Age of Onset  . Cancer Neg Hx   . Heart disease Neg Hx     Social History:  reports that she has never smoked. She has never used smokeless tobacco. She reports that she  does not drink alcohol or use drugs.  Allergies: No Known Allergies  No medications prior to admission.    Review of Systems  Cardiovascular: Negative for chest pain.  Gastrointestinal: Positive for abdominal pain (with menses).    There were no vitals taken for this visit. Physical Exam  Constitutional: She appears well-developed.  Respiratory: Effort normal.  GI: Soft.  Infraumbilical incision --hernia repair with mesh  Genitourinary: Vagina normal and uterus normal.  Genitourinary Comments: Cervix with fair descensus  Musculoskeletal: Normal range of motion.  Neurological: She is alert.  Psychiatric: She has a normal mood and affect.    No results found for this or any previous visit (from the past 24 hour(s)).  No results found.  Assessment/Plan: d/w pt menorrhagia and pelvic pain/dysmenorrhea in detail.. We discused normal US would not r/o or confirm endometriosis and that can only be done surgically. Since failing OCP's we disussed other medicinal options of Lupron or new oral GRNH antagonists. We also discussed surgical options of laparoscopy vs LAVH/BS. I told her I would not recommend ovary removal at 27 y/o.  She considered all options and desires to proceed with LAVH/BS given that the pain is the most troublesome to her and always surrounds her menstrual cycle. The patient was counseled regarding the risks of laparoscopic assisted vaginal hysterectomy, The procedure was reviewed in detail and expectations  regarding recovery.  We also discussed fulguration of any endometriosis we may discover. Risks of bleeding, infection and possible damage to bowel, ureters and bladder were reviewed. The patient understands that should a complication arise she would likely need a larger abdominal incision and that this would delay her recovery. She would accept a blood transfusion if needed. We also discussed removal of the fallopian tubes as a means of possibly reducing future risk of  ovarian cancer and she is agreeable to this. She will retain her ovaries unless there is obvious pathology the would warrant removal. I d/w her would likely try an entry that would avoid her mesh.  She is ready to proceed    Oliver Pila 10/07/2017, 6:04 PM

## 2017-10-07 NOTE — Anesthesia Preprocedure Evaluation (Signed)
Anesthesia Evaluation  Patient identified by MRN, date of birth, ID band Patient awake    Reviewed: Allergy & Precautions, H&P , NPO status , Patient's Chart, lab work & pertinent test results  History of Anesthesia Complications (+) PONV and history of anesthetic complications  Airway Mallampati: II  TM Distance: >3 FB Neck ROM: Full    Dental no notable dental hx.    Pulmonary neg pulmonary ROS,    Pulmonary exam normal breath sounds clear to auscultation       Cardiovascular negative cardio ROS Normal cardiovascular exam Rhythm:Regular Rate:Normal     Neuro/Psych PSYCHIATRIC DISORDERS Anxiety Depression negative neurological ROS     GI/Hepatic negative GI ROS, Neg liver ROS,   Endo/Other  Morbid obesity  Renal/GU Renal disease  negative genitourinary   Musculoskeletal negative musculoskeletal ROS (+)   Abdominal   Peds negative pediatric ROS (+)  Hematology  (+) anemia ,   Anesthesia Other Findings   Reproductive/Obstetrics negative OB ROS                             Anesthesia Physical  Anesthesia Plan  ASA: II  Anesthesia Plan: General   Post-op Pain Management:    Induction: Intravenous  PONV Risk Score and Plan: 3 and Ondansetron, Dexamethasone, Treatment may vary due to age or medical condition, Midazolam and Scopolamine patch - Pre-op  Airway Management Planned: Oral ETT  Additional Equipment:   Intra-op Plan:   Post-operative Plan: Extubation in OR  Informed Consent: I have reviewed the patients History and Physical, chart, labs and discussed the procedure including the risks, benefits and alternatives for the proposed anesthesia with the patient or authorized representative who has indicated his/her understanding and acceptance.     Plan Discussed with: Anesthesiologist, CRNA and Surgeon  Anesthesia Plan Comments: (  )       Anesthesia Quick  Evaluation

## 2017-10-08 ENCOUNTER — Encounter (HOSPITAL_BASED_OUTPATIENT_CLINIC_OR_DEPARTMENT_OTHER)
Admission: RE | Disposition: A | Discharge status: Home / Self Care | Payer: Self-pay | Source: Ambulatory Visit | Attending: Obstetrics and Gynecology

## 2017-10-08 ENCOUNTER — Encounter (HOSPITAL_BASED_OUTPATIENT_CLINIC_OR_DEPARTMENT_OTHER): Payer: Self-pay

## 2017-10-08 ENCOUNTER — Other Ambulatory Visit: Payer: Self-pay

## 2017-10-08 ENCOUNTER — Ambulatory Visit (HOSPITAL_BASED_OUTPATIENT_CLINIC_OR_DEPARTMENT_OTHER): Payer: 59 | Admitting: Anesthesiology

## 2017-10-08 ENCOUNTER — Ambulatory Visit (HOSPITAL_BASED_OUTPATIENT_CLINIC_OR_DEPARTMENT_OTHER)
Admission: RE | Admit: 2017-10-08 | Discharge: 2017-10-09 | Disposition: A | Discharge status: Home / Self Care | Payer: 59 | Source: Ambulatory Visit | Attending: Obstetrics and Gynecology | Admitting: Obstetrics and Gynecology

## 2017-10-08 DIAGNOSIS — Z6836 Body mass index (BMI) 36.0-36.9, adult: Secondary | ICD-10-CM | POA: Insufficient documentation

## 2017-10-08 DIAGNOSIS — N83209 Unspecified ovarian cyst, unspecified side: Secondary | ICD-10-CM | POA: Insufficient documentation

## 2017-10-08 DIAGNOSIS — N736 Female pelvic peritoneal adhesions (postinfective): Secondary | ICD-10-CM | POA: Insufficient documentation

## 2017-10-08 DIAGNOSIS — N63 Unspecified lump in unspecified breast: Secondary | ICD-10-CM | POA: Insufficient documentation

## 2017-10-08 DIAGNOSIS — N920 Excessive and frequent menstruation with regular cycle: Secondary | ICD-10-CM | POA: Insufficient documentation

## 2017-10-08 DIAGNOSIS — F419 Anxiety disorder, unspecified: Secondary | ICD-10-CM | POA: Insufficient documentation

## 2017-10-08 DIAGNOSIS — Z79899 Other long term (current) drug therapy: Secondary | ICD-10-CM | POA: Diagnosis not present

## 2017-10-08 DIAGNOSIS — F329 Major depressive disorder, single episode, unspecified: Secondary | ICD-10-CM | POA: Diagnosis not present

## 2017-10-08 DIAGNOSIS — N840 Polyp of corpus uteri: Secondary | ICD-10-CM | POA: Diagnosis not present

## 2017-10-08 DIAGNOSIS — N946 Dysmenorrhea, unspecified: Secondary | ICD-10-CM | POA: Diagnosis present

## 2017-10-08 DIAGNOSIS — R102 Pelvic and perineal pain: Secondary | ICD-10-CM | POA: Diagnosis present

## 2017-10-08 DIAGNOSIS — Z90710 Acquired absence of both cervix and uterus: Secondary | ICD-10-CM | POA: Diagnosis present

## 2017-10-08 HISTORY — PX: LAPAROSCOPIC VAGINAL HYSTERECTOMY WITH SALPINGECTOMY: SHX6680

## 2017-10-08 LAB — TYPE AND SCREEN
ABO/RH(D): A POS
ANTIBODY SCREEN: NEGATIVE

## 2017-10-08 LAB — POCT PREGNANCY, URINE: PREG TEST UR: NEGATIVE

## 2017-10-08 LAB — ABO/RH: ABO/RH(D): A POS

## 2017-10-08 SURGERY — HYSTERECTOMY, VAGINAL, LAPAROSCOPY-ASSISTED, WITH SALPINGECTOMY
Anesthesia: General | Laterality: Bilateral

## 2017-10-08 MED ORDER — ONDANSETRON HCL 4 MG/2ML IJ SOLN
4.0000 mg | Freq: Once | INTRAMUSCULAR | Status: DC | PRN
  Filled 2017-10-08: qty 2

## 2017-10-08 MED ORDER — OXYCODONE-ACETAMINOPHEN 5-325 MG PO TABS
1.0000 | ORAL_TABLET | ORAL | Status: DC | PRN
  Filled 2017-10-08: qty 1

## 2017-10-08 MED ORDER — MIDAZOLAM HCL 2 MG/2ML IJ SOLN
INTRAMUSCULAR | Status: AC
  Filled 2017-10-08: qty 2

## 2017-10-08 MED ORDER — SIMETHICONE 80 MG PO CHEW
80.0000 mg | CHEWABLE_TABLET | Freq: Four times a day (QID) | ORAL | Status: DC | PRN
  Filled 2017-10-08: qty 1

## 2017-10-08 MED ORDER — VENLAFAXINE HCL ER 150 MG PO CP24
150.0000 mg | ORAL_CAPSULE | Freq: Every day | ORAL | Status: DC
  Administered 2017-10-09: 150 mg via ORAL
  Filled 2017-10-08 (×2): qty 1

## 2017-10-08 MED ORDER — KETOROLAC TROMETHAMINE 30 MG/ML IJ SOLN
30.0000 mg | Freq: Four times a day (QID) | INTRAMUSCULAR | Status: AC
  Administered 2017-10-08 – 2017-10-09 (×3): 30 mg via INTRAVENOUS
  Filled 2017-10-08: qty 1

## 2017-10-08 MED ORDER — METOCLOPRAMIDE HCL 5 MG/ML IJ SOLN
INTRAMUSCULAR | Status: AC
  Filled 2017-10-08: qty 2

## 2017-10-08 MED ORDER — BUPIVACAINE HCL (PF) 0.25 % IJ SOLN
INTRAMUSCULAR | Status: DC | PRN
  Administered 2017-10-08: 10 mL

## 2017-10-08 MED ORDER — ROCURONIUM BROMIDE 10 MG/ML (PF) SYRINGE
PREFILLED_SYRINGE | INTRAVENOUS | Status: AC
  Filled 2017-10-08: qty 5

## 2017-10-08 MED ORDER — SENNOSIDES-DOCUSATE SODIUM 8.6-50 MG PO TABS
1.0000 | ORAL_TABLET | Freq: Every evening | ORAL | Status: DC | PRN
  Filled 2017-10-08: qty 1

## 2017-10-08 MED ORDER — ROCURONIUM BROMIDE 100 MG/10ML IV SOLN
INTRAVENOUS | Status: DC | PRN
  Administered 2017-10-08: 10 mg via INTRAVENOUS
  Administered 2017-10-08: 5 mg via INTRAVENOUS
  Administered 2017-10-08: 60 mg via INTRAVENOUS
  Administered 2017-10-08: 10 mg via INTRAVENOUS

## 2017-10-08 MED ORDER — FENTANYL CITRATE (PF) 250 MCG/5ML IJ SOLN
INTRAMUSCULAR | Status: DC | PRN
  Administered 2017-10-08 (×10): 50 ug via INTRAVENOUS

## 2017-10-08 MED ORDER — DIPHENHYDRAMINE HCL 50 MG/ML IJ SOLN
INTRAMUSCULAR | Status: AC
  Filled 2017-10-08: qty 1

## 2017-10-08 MED ORDER — MIDAZOLAM HCL 2 MG/2ML IJ SOLN
INTRAMUSCULAR | Status: DC | PRN
  Administered 2017-10-08: 2 mg via INTRAVENOUS

## 2017-10-08 MED ORDER — FENTANYL CITRATE (PF) 250 MCG/5ML IJ SOLN
INTRAMUSCULAR | Status: AC
  Filled 2017-10-08: qty 5

## 2017-10-08 MED ORDER — ONDANSETRON HCL 4 MG/2ML IJ SOLN
4.0000 mg | Freq: Four times a day (QID) | INTRAMUSCULAR | Status: DC | PRN
  Filled 2017-10-08: qty 2

## 2017-10-08 MED ORDER — ACETAMINOPHEN 160 MG/5ML PO SOLN
325.0000 mg | ORAL | Status: DC | PRN
  Filled 2017-10-08: qty 20.3

## 2017-10-08 MED ORDER — MEPERIDINE HCL 25 MG/ML IJ SOLN
6.2500 mg | INTRAMUSCULAR | Status: DC | PRN
  Filled 2017-10-08: qty 1

## 2017-10-08 MED ORDER — ONDANSETRON HCL 4 MG PO TABS
4.0000 mg | ORAL_TABLET | Freq: Four times a day (QID) | ORAL | Status: DC | PRN
  Filled 2017-10-08: qty 1

## 2017-10-08 MED ORDER — FENTANYL CITRATE (PF) 100 MCG/2ML IJ SOLN
25.0000 ug | INTRAMUSCULAR | Status: DC | PRN
  Administered 2017-10-08 (×2): 25 ug via INTRAVENOUS
  Filled 2017-10-08: qty 1

## 2017-10-08 MED ORDER — DEXAMETHASONE SODIUM PHOSPHATE 10 MG/ML IJ SOLN
INTRAMUSCULAR | Status: DC | PRN
  Administered 2017-10-08: 10 mg via INTRAVENOUS

## 2017-10-08 MED ORDER — VASOPRESSIN 20 UNIT/ML IV SOLN
INTRAVENOUS | Status: DC | PRN
  Administered 2017-10-08: 8 mL via INTRAMUSCULAR

## 2017-10-08 MED ORDER — OXYCODONE HCL 5 MG PO TABS
5.0000 mg | ORAL_TABLET | Freq: Once | ORAL | Status: AC | PRN
  Administered 2017-10-08: 5 mg via ORAL
  Filled 2017-10-08: qty 1

## 2017-10-08 MED ORDER — SCOPOLAMINE 1 MG/3DAYS TD PT72
MEDICATED_PATCH | TRANSDERMAL | Status: AC
  Filled 2017-10-08: qty 1

## 2017-10-08 MED ORDER — CEFAZOLIN SODIUM-DEXTROSE 2-4 GM/100ML-% IV SOLN
INTRAVENOUS | Status: AC
  Filled 2017-10-08: qty 100

## 2017-10-08 MED ORDER — DIPHENHYDRAMINE HCL 50 MG/ML IJ SOLN
INTRAMUSCULAR | Status: DC | PRN
  Administered 2017-10-08: 25 mg via INTRAVENOUS

## 2017-10-08 MED ORDER — ONDANSETRON HCL 4 MG/2ML IJ SOLN
INTRAMUSCULAR | Status: AC
  Filled 2017-10-08: qty 2

## 2017-10-08 MED ORDER — ONDANSETRON HCL 4 MG/2ML IJ SOLN
INTRAMUSCULAR | Status: DC | PRN
  Administered 2017-10-08: 4 mg via INTRAVENOUS

## 2017-10-08 MED ORDER — MENTHOL 3 MG MT LOZG
1.0000 | LOZENGE | OROMUCOSAL | Status: DC | PRN
  Filled 2017-10-08: qty 9

## 2017-10-08 MED ORDER — SCOPOLAMINE 1 MG/3DAYS TD PT72
1.0000 | MEDICATED_PATCH | TRANSDERMAL | Status: DC
  Administered 2017-10-08: 1.5 mg via TRANSDERMAL
  Filled 2017-10-08: qty 1

## 2017-10-08 MED ORDER — PROPOFOL 10 MG/ML IV BOLUS
INTRAVENOUS | Status: AC
  Filled 2017-10-08: qty 20

## 2017-10-08 MED ORDER — LACTATED RINGERS IV SOLN
INTRAVENOUS | Status: DC
  Administered 2017-10-08 (×2): via INTRAVENOUS
  Filled 2017-10-08 (×3): qty 1000

## 2017-10-08 MED ORDER — DEXAMETHASONE SODIUM PHOSPHATE 10 MG/ML IJ SOLN
INTRAMUSCULAR | Status: AC
  Filled 2017-10-08: qty 1

## 2017-10-08 MED ORDER — KETOROLAC TROMETHAMINE 30 MG/ML IJ SOLN
INTRAMUSCULAR | Status: AC
  Filled 2017-10-08: qty 1

## 2017-10-08 MED ORDER — SUGAMMADEX SODIUM 200 MG/2ML IV SOLN
INTRAVENOUS | Status: AC
  Filled 2017-10-08: qty 2

## 2017-10-08 MED ORDER — LACTATED RINGERS IV SOLN
INTRAVENOUS | Status: DC
  Administered 2017-10-08 (×3): via INTRAVENOUS
  Filled 2017-10-08: qty 1000

## 2017-10-08 MED ORDER — ACETAMINOPHEN 325 MG PO TABS
325.0000 mg | ORAL_TABLET | ORAL | Status: DC | PRN
  Filled 2017-10-08: qty 2

## 2017-10-08 MED ORDER — METOCLOPRAMIDE HCL 5 MG/ML IJ SOLN
INTRAMUSCULAR | Status: DC | PRN
  Administered 2017-10-08: 10 mg via INTRAVENOUS

## 2017-10-08 MED ORDER — KETOROLAC TROMETHAMINE 30 MG/ML IJ SOLN
INTRAMUSCULAR | Status: DC | PRN
  Administered 2017-10-08: 30 mg via INTRAVENOUS

## 2017-10-08 MED ORDER — LIDOCAINE HCL (CARDIAC) 20 MG/ML IV SOLN
INTRAVENOUS | Status: DC | PRN
  Administered 2017-10-08: 70 mg via INTRAVENOUS

## 2017-10-08 MED ORDER — HYDROMORPHONE HCL 1 MG/ML IJ SOLN
0.2000 mg | INTRAMUSCULAR | Status: DC | PRN
  Filled 2017-10-08: qty 1

## 2017-10-08 MED ORDER — CEFAZOLIN SODIUM-DEXTROSE 2-4 GM/100ML-% IV SOLN
2.0000 g | INTRAVENOUS | Status: AC
  Administered 2017-10-08: 2 g via INTRAVENOUS
  Filled 2017-10-08: qty 100

## 2017-10-08 MED ORDER — SUGAMMADEX SODIUM 200 MG/2ML IV SOLN
INTRAVENOUS | Status: DC | PRN
  Administered 2017-10-08: 180 mg via INTRAVENOUS

## 2017-10-08 MED ORDER — KETOROLAC TROMETHAMINE 30 MG/ML IJ SOLN
30.0000 mg | Freq: Four times a day (QID) | INTRAMUSCULAR | Status: AC
  Filled 2017-10-08: qty 1

## 2017-10-08 MED ORDER — FENTANYL CITRATE (PF) 100 MCG/2ML IJ SOLN
INTRAMUSCULAR | Status: AC
  Filled 2017-10-08: qty 2

## 2017-10-08 MED ORDER — PROPOFOL 10 MG/ML IV BOLUS
INTRAVENOUS | Status: DC | PRN
  Administered 2017-10-08: 200 mg via INTRAVENOUS

## 2017-10-08 MED ORDER — OXYCODONE HCL 5 MG/5ML PO SOLN
5.0000 mg | Freq: Once | ORAL | Status: AC | PRN
  Filled 2017-10-08: qty 5

## 2017-10-08 MED ORDER — LIDOCAINE 2% (20 MG/ML) 5 ML SYRINGE
INTRAMUSCULAR | Status: AC
  Filled 2017-10-08: qty 5

## 2017-10-08 MED ORDER — OXYCODONE HCL 5 MG PO TABS
ORAL_TABLET | ORAL | Status: AC
  Filled 2017-10-08: qty 1

## 2017-10-08 SURGICAL SUPPLY — 47 items
ADH SKN CLS APL DERMABOND .7 (GAUZE/BANDAGES/DRESSINGS) ×1
CABLE HIGH FREQUENCY MONO STRZ (ELECTRODE) IMPLANT
CATH ROBINSON RED A/P 16FR (CATHETERS) IMPLANT
CLOTH BEACON ORANGE TIMEOUT ST (SAFETY) ×3 IMPLANT
CONT PATH 16OZ SNAP LID 3702 (MISCELLANEOUS) ×3 IMPLANT
COVER BACK TABLE 60X90IN (DRAPES) ×3 IMPLANT
COVER MAYO STAND STRL (DRAPES) ×2 IMPLANT
COVER SURGICAL LIGHT HANDLE (MISCELLANEOUS) ×2 IMPLANT
DECANTER SPIKE VIAL GLASS SM (MISCELLANEOUS) IMPLANT
DERMABOND ADVANCED (GAUZE/BANDAGES/DRESSINGS) ×2
DERMABOND ADVANCED .7 DNX12 (GAUZE/BANDAGES/DRESSINGS) IMPLANT
DRSG COVADERM PLUS 2X2 (GAUZE/BANDAGES/DRESSINGS) ×4 IMPLANT
DRSG OPSITE POSTOP 3X4 (GAUZE/BANDAGES/DRESSINGS) ×2 IMPLANT
DURAPREP 26ML APPLICATOR (WOUND CARE) ×3 IMPLANT
ELECT REM PT RETURN 9FT ADLT (ELECTROSURGICAL) ×3
ELECTRODE REM PT RTRN 9FT ADLT (ELECTROSURGICAL) ×1 IMPLANT
GAUZE SPONGE 4X4 16PLY XRAY LF (GAUZE/BANDAGES/DRESSINGS) IMPLANT
GLOVE BIO SURGEON STRL SZ7 (GLOVE) ×9 IMPLANT
GLOVE ECLIPSE 6.5 STRL STRAW (GLOVE) ×5 IMPLANT
GOWN STRL REUS W/ TWL XL LVL3 (GOWN DISPOSABLE) IMPLANT
GOWN STRL REUS W/TWL XL LVL3 (GOWN DISPOSABLE) ×15
HOLDER FOLEY CATH W/STRAP (MISCELLANEOUS) ×2 IMPLANT
IV NS IRRIG 3000ML ARTHROMATIC (IV SOLUTION) ×2 IMPLANT
LEGGING LITHOTOMY PAIR STRL (DRAPES) ×3 IMPLANT
NEEDLE INSUFFLATION 120MM (ENDOMECHANICALS) ×3 IMPLANT
NS IRRIG 1000ML POUR BTL (IV SOLUTION) ×1 IMPLANT
PACK LAVH (CUSTOM PROCEDURE TRAY) ×3 IMPLANT
PACK ROBOTIC GOWN (GOWN DISPOSABLE) ×1 IMPLANT
PACK TRENDGUARD 450 HYBRID PRO (MISCELLANEOUS) IMPLANT
PACK TRENDGUARD 600 HYBRD PROC (MISCELLANEOUS) IMPLANT
SET IRRIG TUBING LAPAROSCOPIC (IRRIGATION / IRRIGATOR) ×3 IMPLANT
SHEARS HARMONIC ACE PLUS 36CM (ENDOMECHANICALS) ×3 IMPLANT
SUT VIC AB 0 CT1 18XCR BRD8 (SUTURE) ×2 IMPLANT
SUT VIC AB 0 CT1 36 (SUTURE) ×3 IMPLANT
SUT VIC AB 0 CT1 8-18 (SUTURE) ×6
SUT VIC AB 2-0 CT1 (SUTURE) ×5 IMPLANT
SUT VICRYL 0 TIES 12 18 (SUTURE) ×2 IMPLANT
SUT VICRYL 0 UR6 27IN ABS (SUTURE) ×2 IMPLANT
SUT VICRYL 4-0 PS2 18IN ABS (SUTURE) ×3 IMPLANT
TOWEL OR 17X24 6PK STRL BLUE (TOWEL DISPOSABLE) ×6 IMPLANT
TRAY FOLEY CATH SILVER 14FR (SET/KITS/TRAYS/PACK) ×3 IMPLANT
TRENDGUARD 450 HYBRID PRO PACK (MISCELLANEOUS) ×3
TRENDGUARD 600 HYBRID PROC PK (MISCELLANEOUS)
TROCAR XCEL NON-BLD 5MMX100MML (ENDOMECHANICALS) ×8 IMPLANT
TUBING INSUF HEATED (TUBING) ×3 IMPLANT
TUBING SUCTION 1/4X6FT (MISCELLANEOUS) ×2 IMPLANT
WARMER LAPAROSCOPE (MISCELLANEOUS) ×3 IMPLANT

## 2017-10-08 NOTE — Progress Notes (Signed)
Patient ID: Tara Gallagher, female   DOB: 1990-08-01, 27 y.o.   MRN: [id number] Per pt no changes in dictated H&P. Brief exam WNL.  Ready to proceed.  Reviewed surgery with her in detail and possible alternate placement of umbilical port given presence of mesh.

## 2017-10-08 NOTE — Anesthesia Procedure Notes (Signed)
Procedure Name: Intubation Date/Time: 10/08/2017 7:23 AM Performed by: Raenette Rover, CRNA Pre-anesthesia Checklist: Patient identified, Emergency Drugs available, Suction available and Patient being monitored Patient Re-evaluated:Patient Re-evaluated prior to induction Oxygen Delivery Method: Circle system utilized Preoxygenation: Pre-oxygenation with 100% oxygen Induction Type: IV induction Ventilation: Mask ventilation without difficulty Laryngoscope Size: Mac and 3 Grade View: Grade I Tube type: Oral Tube size: 7.0 mm Number of attempts: 1 Airway Equipment and Method: Stylet Placement Confirmation: ETT inserted through vocal cords under direct vision,  CO2 detector,  positive ETCO2 and breath sounds checked- equal and bilateral Secured at: 21 cm Tube secured with: Tape Dental Injury: Teeth and Oropharynx as per pre-operative assessment

## 2017-10-08 NOTE — Anesthesia Postprocedure Evaluation (Signed)
Anesthesia Post Note  Patient: Adria DevonMeagan E Miera  Procedure(s) Performed: LAPAROSCOPIC ASSISTED VAGINAL HYSTERECTOMY WITH SALPINGECTOMY (Bilateral )     Patient location during evaluation: PACU Anesthesia Type: General Level of consciousness: awake and alert Pain management: pain level controlled Vital Signs Assessment: post-procedure vital signs reviewed and stable Respiratory status: spontaneous breathing, nonlabored ventilation, respiratory function stable and patient connected to nasal cannula oxygen Cardiovascular status: blood pressure returned to baseline and stable Postop Assessment: no apparent nausea or vomiting Anesthetic complications: no    Last Vitals:  Vitals:   10/08/17 1143 10/08/17 1250  BP: 134/74 138/76  Pulse: 95 74  Resp: 16 16  Temp: 37.1 C 37.2 C  SpO2: 99% 98%    Last Pain:  Vitals:   10/08/17 1221  TempSrc:   PainSc: 5                  Jaquaya Coyle

## 2017-10-08 NOTE — Progress Notes (Signed)
Day of Surgery Procedure(s) (LRB): LAPAROSCOPIC ASSISTED VAGINAL HYSTERECTOMY WITH SALPINGECTOMY (Bilateral)  Subjective: Patient reports tolerating PO.  She has no nausea and ate a light lunch.  Mild cramping only, no other pain.  No VB   Objective: I have reviewed patient's vital signs and intake and output. UOP 400mL in bag, clear.  General: alert and cooperative GI: incision: clean, dry and intact and soft NT Vaginal Bleeding: minimal  Assessment: s/p Procedure(s) with comments: LAPAROSCOPIC ASSISTED VAGINAL HYSTERECTOMY WITH SALPINGECTOMY (Bilateral) - OUT PATIENT IN BED: stable and progressing well  Plan: Will try to ambulate patient later this PM and remove foley if does well with no dizziness. Plan for d/c tomorrow   LOS: 0 days    Oliver PilaKathy W Vann Okerlund 10/08/2017, 4:08 PM

## 2017-10-08 NOTE — Transfer of Care (Signed)
Immediate Anesthesia Transfer of Care Note  Patient: Tara Gallagher  Procedure(s) Performed: LAPAROSCOPIC ASSISTED VAGINAL HYSTERECTOMY WITH SALPINGECTOMY (Bilateral )  Patient Location: PACU  Anesthesia Type:General  Level of Consciousness: awake, alert , oriented, drowsy and patient cooperative  Airway & Oxygen Therapy: Patient Spontanous Breathing and Patient connected to nasal cannula oxygen  Post-op Assessment: Report given to RN and Post -op Vital signs reviewed and stable  Post vital signs: Reviewed and stable  Last Vitals:  Vitals Value Taken Time  BP 118/58 10/08/2017  9:53 AM  Temp    Pulse 89 10/08/2017  9:56 AM  Resp 15 10/08/2017  9:56 AM  SpO2 100 % 10/08/2017  9:56 AM  Vitals shown include unvalidated device data.  Last Pain:  Vitals:   10/08/17 0627  TempSrc:   PainSc: 0-No pain         Complications: No apparent anesthesia complications

## 2017-10-08 NOTE — Progress Notes (Signed)
Dr. Senaida Oresichardson in to evaluate pt earlier.  Pt ambulated x 2 this afternoon in hall w/ standby asst.  No c/o nausea and minimal pain.  Tolerating po fluids well.  Foley d/c per Dr. Senaida Oresichardson.  Pt due to void.

## 2017-10-08 NOTE — Op Note (Signed)
Operative Note    Preoperative Diagnosis Dysmenorrhea Pelvic Pain Menorrhagia  Postoperative Diagnosis same  Procedure Laparoscopic assisted vaginal hysterectomy and bilateral salpingectomies  Surgeon Huel Cote, MD Sherron Monday, MD  Anesthesia GETA  Fluids: EBL UOP clear IVF LR  Findings Uterus was slightly enlarged and boggy.  The ovaries and tubes appeared normal with no apparent endometriosis noted.  The right ovary had a functional cyst.  The cervix was distal in the vagina, with only fair descensus.  The mesh at her umbilicus was not visible but no residual hernia noted  Specimen Uterus, tubes and ovaries  Procedure Note Patient was taken to the operating room where general anesthesia was obtained without difficulty. She was then prepped and draped in the normal sterile fashion in the dorsal lithotomy position. An appropriate timeout was performed. A speculum was then placed within the vagina and a Hulka tenaculum placed within the cervix for uterine manipulation. A foley catheter was placed in the bladder.  Attention was then turned to the patient's abdomen after draping, attention was turned to the left upper quadrant as the desired point of entry given her prior umbilical hernia and mesh placement.  This was injected with approximately 10 cc of quarter percent Marcaine. A 0.5 cm incision was then made  and the varies needle easily introduced into the peritoneal cavity. Intraperitoneal placement was confirmed by aspiration and injection with normal saline. Gas flow was then applied and a pneumoperitoneum obtained with approximate 3 L of CO2 gas. The varies needle was then removed and a 5 mm optiview trocar was easily introduced into the abdomen under direct visualization. . With patient in Trendelenburg the uterus and tubes and ovaries were inspected with findings as previously stated. Two additional trocars were placed in the upper lateral right  quadrant and superior to the umbilicus to avoid her prior mesh placement.  This was done under direct visualization after injection with quarter percent marcaine.  The Harmonic scalpel was then utilized to dissect the fallopian tubes from the mesosalpinx bilaterally down to the level of the cornua.  The remainder of the uteroovarian ligament and the round ligament were then also taken down with the Harmonic to the level of the bladder flap.  The bladder flap was taken down from the lower uterine segment and pushed away to expose the cervix.  An area of bleeding at the left uterine artery was cauterize.  Attention was then turned to the vagina after all instruments were removed and the trocars covered with a sterile drape. The cervix was grasped with [place] tenaculums x 2 and injected with a dilute solution of Pitressin circumferentially.  The bovie was then used to make a circumferential incision.  The mayo scissors then further dissected the vaginal mucosa from the underlying cervix and the anterior and posterior cul de sac entered sharply.  The posterior cul-de-sac was very distal in the vagina, so visualization was a bit more challenging.  Slow dissection was performed hugging the cervix until the cul-de-sac was opened.  With a banana speculum and deaver retractor isolating the uterus from the bladder and rectum.  The uterosacral ligaments and paracervical tissue was taken down sequentially with parametrial clamps and suture ligated with zero vicryl at each step.  When  the was uterus freed on the patient's left, it was then delivered and the remaining tissue on the right clamped and transected completely freeing the uterus and tubes.  It was handed off to pathology.   A small  amount of bleeding on the left was controlled with several figure of eight sutures of zero vicryl at the cuff angle.the area was then hemostatic. The uterosacral ligaments were approximated with zero vicryl.  The short weighted speculum  was placed and the posterior cuff run with a running locked 2-0 vicryl for hemostasis.  The cuff was then closed in a running locked fashion with 2-0 vicryl.   All instruments were then removed from the vagina.  Gowns and gloves were changed and attention was returned to the abdomen, where pneumoperitoneum was again obtained and all inspected.  The cuff and pedicles were hemostatic and the ureters visualized and normal in caliber and seen to be peristalsing. A four quadrant view of the pelvis and abdomen was performed and found to be normal with no bleeding or obvious  injuries noted.  The instruments were removed from the abdomen as well as the 5 mm lateral ports under visualization.  The pneumoperitoneum was reduced through the trocar. The trocar was finally removed and the infraumbilical incision and lateral incisions were closed with a subcuticular stitch of 3-0 Vicryl. Dermabond and a bandage were placed. Patient was then awakened and taken to the recovery room in good condition.

## 2017-10-09 ENCOUNTER — Encounter (HOSPITAL_BASED_OUTPATIENT_CLINIC_OR_DEPARTMENT_OTHER): Payer: Self-pay | Admitting: Obstetrics and Gynecology

## 2017-10-09 DIAGNOSIS — N840 Polyp of corpus uteri: Secondary | ICD-10-CM | POA: Diagnosis not present

## 2017-10-09 LAB — CBC
HEMATOCRIT: 31.8 % — AB (ref 36.0–46.0)
Hemoglobin: 10.3 g/dL — ABNORMAL LOW (ref 12.0–15.0)
MCH: 30 pg (ref 26.0–34.0)
MCHC: 32.4 g/dL (ref 30.0–36.0)
MCV: 92.7 fL (ref 78.0–100.0)
Platelets: 240 10*3/uL (ref 150–400)
RBC: 3.43 MIL/uL — ABNORMAL LOW (ref 3.87–5.11)
RDW: 12.8 % (ref 11.5–15.5)
WBC: 13.7 10*3/uL — ABNORMAL HIGH (ref 4.0–10.5)

## 2017-10-09 LAB — BASIC METABOLIC PANEL
Anion gap: 8 (ref 5–15)
BUN: 7 mg/dL (ref 6–20)
CALCIUM: 8.3 mg/dL — AB (ref 8.9–10.3)
CHLORIDE: 107 mmol/L (ref 101–111)
CO2: 28 mmol/L (ref 22–32)
CREATININE: 0.65 mg/dL (ref 0.44–1.00)
GFR calc non Af Amer: >60 mL/min (ref >60)
Glucose, Bld: 109 mg/dL — ABNORMAL HIGH (ref 65–99)
Potassium: 3.6 mmol/L (ref 3.5–5.1)
Sodium: 143 mmol/L (ref 135–145)

## 2017-10-09 MED ORDER — IBUPROFEN 600 MG PO TABS: 600.0000 mg | ORAL_TABLET | Freq: Four times a day (QID) | ORAL | 0 refills | Status: DC | PRN

## 2017-10-09 MED ORDER — KETOROLAC TROMETHAMINE 30 MG/ML IJ SOLN
INTRAMUSCULAR | Status: AC
  Filled 2017-10-09: qty 1

## 2017-10-09 MED ORDER — IBUPROFEN 600 MG PO TABS
600.0000 mg | ORAL_TABLET | Freq: Four times a day (QID) | ORAL | Status: DC | PRN
  Administered 2017-10-09: 600 mg via ORAL
  Filled 2017-10-09: qty 1

## 2017-10-09 MED ORDER — IBUPROFEN 200 MG PO TABS
ORAL_TABLET | ORAL | Status: AC
  Filled 2017-10-09: qty 3

## 2017-10-09 NOTE — Discharge Summary (Signed)
Physician Discharge Summary  Patient ID: Tara Gallagher MRN: [id number] DOB/AGE: 03/04/1991 27 y.o.  Admit date: 10/08/2017 Discharge date: 10/09/2017  Admission Diagnoses:  Discharge Diagnoses:  Active Problems:   S/P laparoscopic assisted vaginal hysterectomy (LAVH)   Discharged Condition: good  Hospital Course: Pt kept for observation s/p LAVH/BS and did well.  By POD #1 was tolerating a regular diet, ambulating and pain controlled.  She had a low grade temp of 99 on POD #1 that resolved by the morning of d/c. She was passing flatus at d/c  Consults: None  Significant Diagnostic Studies: labs: CBC, BMP  Discharge Exam: Blood pressure 128/75, pulse 83, temperature 98.7 F (37.1 C), temperature source Oral, resp. rate 20, height 5\' 2"  (1.575 m), weight 198 lb 8 oz (90 kg), last menstrual period 10/01/2017, SpO2 100 %. General appearance: alert and cooperative GI: soft NT Incision/Wound:C/D/I  Disposition: Discharge disposition: 01-Home or Self Care       Discharge Instructions    Call MD for:  persistant nausea and vomiting   Complete by:  As directed    Call MD for:  redness, tenderness, or signs of infection (pain, swelling, redness, odor or green/yellow discharge around incision site)   Complete by:  As directed    Call MD for:  severe uncontrolled pain   Complete by:  As directed    Call MD for:  temperature >100.4   Complete by:  As directed    Diet - low sodium heart healthy   Complete by:  As directed    Discharge instructions   Complete by:  As directed    Avoid driving for at least 1-2 weeks or until off narcotic pain meds.  No heavy lifting greater than 10 lbs.  Nothing in vagina for 6 weeks.  May remove bandage in 1-2 days.  Shower over incisions and pat dry.     Allergies as of 10/09/2017   No Known Allergies     Medication List    STOP taking these medications   SPRINTEC 28 0.25-35 MG-MCG tablet Generic drug:  norgestimate-ethinyl estradiol      TAKE these medications   ferrous sulfate 325 (65 FE) MG tablet Take 650 mg by mouth daily with breakfast.   ibuprofen 600 MG tablet Commonly known as:  ADVIL,MOTRIN Take 1 tablet (600 mg total) by mouth every 6 (six) hours as needed for moderate pain.   venlafaxine XR 150 MG 24 hr capsule Commonly known as:  EFFEXOR-XR TAKE 1 CAPSULE EVERY DAY WITH BREAKFAST. NEED OFFICE VISIT PRIOR TO MORE REFILLS      Follow-up Information    Huel Coteichardson, Tara Lippold, MD. Schedule an appointment as soon as possible for a visit in 2 week(s).   Specialty:  Obstetrics and Gynecology Why:  incision check Contact information: [address] [place] [address] [phone]           Signed: Oliver PilaKathy W Jaydn Gallagher 10/09/2017, 8:35 AM

## 2017-10-09 NOTE — Progress Notes (Signed)
10/09/2017 1045 Verbal order received Dr. Senaida Oresichardson ok for d/c home at this time. Orders enacted.  Jaclene Bartelt, Blanchard KelchStephanie Ingold

## 2017-10-09 NOTE — Progress Notes (Signed)
1 Day Post-Op Procedure(s) (LRB): LAPAROSCOPIC ASSISTED VAGINAL HYSTERECTOMY WITH SALPINGECTOMY (Bilateral)  Subjective: Patient reports tolerating PO, + flatus and no problems voiding.  Ambulating well with no pain.  No chills  Objective: I have reviewed patient's vital signs, intake and output and labs.  General: alert and cooperative GI: incision: clean, dry, intact and belly soft NT Vaginal Bleeding: minimal  Assessment: s/p Procedure(s) with comments: LAPAROSCOPIC ASSISTED VAGINAL HYSTERECTOMY WITH SALPINGECTOMY (Bilateral) - OUT PATIENT IN BED: progressing well and tolerating diet  Plan: Encourage ambulation  Had low grade temp of 99 overnight that has resolved this AM.  Will watch over next 2 hours and if no temp d/c home Pain minimal, feels she will be fine with ibuprofen at home.  Declines narcotic pain meds.  LOS: 0 days    Oliver PilaKathy W Darolyn Double 10/09/2017, 8:28 AM

## 2017-11-27 ENCOUNTER — Other Ambulatory Visit: Payer: Self-pay | Admitting: *Deleted

## 2017-11-27 MED ORDER — VENLAFAXINE HCL ER 150 MG PO CP24: ORAL_CAPSULE | ORAL | 0 refills | Status: DC

## 2018-02-01 ENCOUNTER — Other Ambulatory Visit: Payer: Self-pay

## 2018-02-01 MED ORDER — VENLAFAXINE HCL ER 150 MG PO CP24: ORAL_CAPSULE | ORAL | 0 refills | Status: DC

## 2018-02-26 ENCOUNTER — Encounter: Payer: Self-pay | Admitting: Family Medicine

## 2018-02-26 ENCOUNTER — Ambulatory Visit (INDEPENDENT_AMBULATORY_CARE_PROVIDER_SITE_OTHER): Payer: 59 | Admitting: Family Medicine

## 2018-02-26 VITALS — BP 103/72 | HR 66 | Temp 98.4°F | Resp 18 | Ht 62.0 in | Wt 203.0 lb

## 2018-02-26 DIAGNOSIS — E669 Obesity, unspecified: Secondary | ICD-10-CM | POA: Diagnosis not present

## 2018-02-26 DIAGNOSIS — F418 Other specified anxiety disorders: Secondary | ICD-10-CM

## 2018-02-26 MED ORDER — VENLAFAXINE HCL ER 150 MG PO CP24: ORAL_CAPSULE | ORAL | 5 refills | Status: DC
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
## 2018-02-26 NOTE — Patient Instructions (Addendum)
I am glad you are doing so well. I have refilled your med today.  Flu shots start around end of September or October--> you can have by nurse appt if desired.    If you would like the next appt in 6 months can be your CPE, make sure to schedule it as a CPE if you are wanting to do that.   Please help us help you:  We are honored [facility]. Below you will find basic instructions that you may need to access in the future. Please help us help you by reading the instructions, which cover many of the frequent questions we experience.   Prescription refills and request:  -In order to allow more efficient response time, please call your pharmacy for all refills. They will forward the request electronically to us. This allows for the quickest possible response. Request left on a nurse line can take longer to refill, since these are checked as time allows between office patients and other phone calls.  - refill request can take up to 3-5 working days to complete.  - If request is sent electronically and request is appropiate, it is usually completed in 1-2 business days.  - all patients will need to be seen routinely for all chronic medical conditions requiring prescription medications (see follow-up below). If you are overdue for follow up on your condition, you will be asked to make an appointment and we will call in enough medication to cover you until your appointment (up to 30 days).  - all controlled substances will require a face to face visit to request/refill.  - if you desire your prescriptions to go through a new pharmacy, and have an active script at original pharmacy, you will need to call your pharmacy and have scripts transferred to new pharmacy. This is completed between the pharmacy locations and not by your provider.    Results: If any images or labs were ordered, it can take up to 1 week to get results depending on the test ordered and  the lab/facility running and resulting the test. - Normal or stable results, which do not need further discussion, may be released to your mychart immediately with attached note to you. A call may not be generated for normal results. Please make certain to sign up for mychart. If you have questions on how to activate your mychart you can call the front office.  - If your results need further discussion, our office will attempt to contact you via phone, and if unable to reach you after 2 attempts, we will release your abnormal result to your mychart with instructions.  - All results will be automatically released in mychart after 1 week.  - Your provider will provide you with explanation and instruction on all relevant material in your results. Please keep in mind, results and labs may appear confusing or abnormal to the untrained eye, but it does not mean they are actually abnormal for you personally. If you have any questions about your results that are not covered, or you desire more detailed explanation than what was provided, you should make an appointment with your provider to do so.   Our office handles many outgoing and incoming calls daily. If we have not contacted you within 1 week about your results, please check your mychart to see if there is a message first and if not, then contact our office.  In helping with this matter, you help  decrease call volume, and therefore allow us to be able to respond to patients needs more efficiently.   Acute office visits (sick visit):  An acute visit is intended for a new problem and are scheduled in shorter time slots to allow schedule openings for patients with new problems. This is the appropriate visit to discuss a new problem. Problems will not be addressed by phone call or Echart message. Appointment is needed if requesting treatment. In order to provide you with excellent quality medical care with proper time for you to explain your problem, have an exam  and receive treatment with instructions, these appointments should be limited to one new problem per visit. If you experience a new problem, in which you desire to be addressed, please make an acute office visit, we save openings on the schedule to accommodate you. Please do not save your new problem for any other type of visit, let us take care of it properly and quickly for you.   Follow up visits:  Depending on your condition(s) your provider will need to see you routinely in order to provide you with quality care and prescribe medication(s). Most chronic conditions (Example: hypertension, Diabetes, depression/anxiety... etc), require visits a couple times a year. Your provider will instruct you on proper follow up for your personal medical conditions and history. Please make certain to make follow up appointments for your condition as instructed. Failing to do so could result in lapse in your medication treatment/refills. If you request a refill, and are overdue to be seen on a condition, we will always provide you with a 30 day script (once) to allow you time to schedule.    Medicare wellness (well visit): - we have a wonderful Nurse Selena Batten(Kim), that will meet with you and provide you will yearly medicare wellness visits. These visits should occur yearly (can not be scheduled less than 1 calendar year apart) and cover preventive health, immunizations, advance directives and screenings you are entitled to yearly through your medicare benefits. Do not miss out on your entitled benefits, this is when medicare will pay for these benefits to be ordered for you.  These are strongly encouraged by your provider and is the appropriate type of visit to make certain you are up to date with all preventive health benefits. If you have not had your medicare wellness exam in the last 12 months, please make certain to schedule one by calling the office and schedule your medicare wellness with Selena BattenKim as soon as possible.    Yearly physical (well visit):  - Adults are recommended to be seen yearly for physicals. Check with your insurance and date of your last physical, most insurances require one calendar year between physicals. Physicals include all preventive health topics, screenings, medical exam and labs that are appropriate for gender/age and history. You may have fasting labs needed at this visit. This is a well visit (not a sick visit), new problems should not be covered during this visit (see acute visit).  - Pediatric patients are seen more frequently when they are younger. Your provider will advise you on well child visit timing that is appropriate for your their age. - This is not a medicare wellness visit. Medicare wellness exams do not have an exam portion to the visit. Some medicare companies allow for a physical, some do not allow a yearly physical. If your medicare allows a yearly physical you can schedule the medicare wellness with our nurse Selena BattenKim and have your physical with your provider  after, on the same day. Please check with insurance for your full benefits.   Late Policy/No Shows:  - all new patients should arrive 15-30 minutes earlier than appointment to allow [place] time  to  obtain all personal demographics,  insurance information and for you to complete office paperwork. - All established patients should arrive 10-15 minutes earlier than appointment time to update all information and be checked in .  - In our best efforts to run on time, if you are late for your appointment you will be asked to either reschedule or if able, we will work you back into the schedule. There will be a wait time to work you back in the schedule,  depending on availability.  - If you are unable to make it to your appointment as scheduled, please call 24 hours ahead of time to allow [place] to fill the time slot with someone else who needs to be seen. If you do not cancel your appointment ahead of time, you may be charged a no show  fee.

## 2018-03-18 ENCOUNTER — Encounter: Payer: Self-pay | Admitting: Family Medicine

## 2018-03-18 ENCOUNTER — Ambulatory Visit (INDEPENDENT_AMBULATORY_CARE_PROVIDER_SITE_OTHER): Payer: 59 | Admitting: Family Medicine

## 2018-03-18 VITALS — BP 114/72 | HR 63 | Temp 98.2°F | Resp 20 | Ht 62.0 in | Wt 203.5 lb

## 2018-03-18 DIAGNOSIS — R51 Headache: Secondary | ICD-10-CM

## 2018-03-18 DIAGNOSIS — R631 Polydipsia: Secondary | ICD-10-CM | POA: Diagnosis not present

## 2018-03-18 DIAGNOSIS — R358 Other polyuria: Secondary | ICD-10-CM | POA: Diagnosis not present

## 2018-03-18 DIAGNOSIS — R7309 Other abnormal glucose: Secondary | ICD-10-CM | POA: Diagnosis not present

## 2018-03-18 DIAGNOSIS — R3589 Other polyuria: Secondary | ICD-10-CM

## 2018-03-18 DIAGNOSIS — R519 Headache, unspecified: Secondary | ICD-10-CM

## 2018-03-18 LAB — COMPREHENSIVE METABOLIC PANEL
ALK PHOS: 75 U/L (ref 39–117)
ALT: 12 U/L (ref 0–35)
AST: 12 U/L (ref 0–37)
Albumin: 4.5 g/dL (ref 3.5–5.2)
BILIRUBIN TOTAL: 0.4 mg/dL (ref 0.2–1.2)
BUN: 13 mg/dL (ref 6–23)
CO2: 33 meq/L — AB (ref 19–32)
Calcium: 9.5 mg/dL (ref 8.4–10.5)
Chloride: 100 mEq/L (ref 96–112)
Creatinine, Ser: 0.78 mg/dL (ref 0.40–1.20)
GFR: 93.51 mL/min (ref >60.00)
GLUCOSE: 88 mg/dL (ref 70–99)
Potassium: 4.3 mEq/L (ref 3.5–5.1)
Sodium: 137 mEq/L (ref 135–145)
TOTAL PROTEIN: 7.6 g/dL (ref 6.0–8.3)

## 2018-03-18 LAB — GLUCOSE, POCT (MANUAL RESULT ENTRY): POC GLUCOSE: 99 mg/dL (ref 70–99)

## 2018-03-18 LAB — HEMOGLOBIN A1C: HEMOGLOBIN A1C: 5.5 % (ref 4.6–6.5)

## 2018-03-18 NOTE — Patient Instructions (Signed)
Start magnesium and B12 over the counter. This can help with headaches. Start a headache log (when, how long, where- any events leading up such as lack of sleep or stress etc)  Get your eyes checked.     Hyperglycemia Hyperglycemia is when the sugar (glucose) level in your blood is too high. It may not cause symptoms. If you do have symptoms, they may include warning signs, such as:  Feeling more thirsty than normal.  Hunger.  Feeling tired.  Needing to pee (urinate) more than normal.  Blurry eyesight (vision).  You may get other symptoms as it gets worse, such as:  Dry mouth.  Not being hungry (loss of appetite).  Fruity-smelling breath.  Weakness.  Weight gain or loss that is not planned. Weight loss may be fast.  A tingling or numb feeling in your hands or feet.  Headache.  Skin that does not bounce back quickly when it is lightly pinched and released (poor skin turgor).  Pain in your belly (abdomen).  Cuts or bruises that heal slowly.  High blood sugar can happen to people who do or do not have diabetes. High blood sugar can happen slowly or quickly, and it can be an emergency. Follow these instructions at home: General instructions  Take over-the-counter and prescription medicines only as told by your doctor.  Do not use products that contain nicotine or tobacco, such as cigarettes and e-cigarettes. If you need help quitting, ask your doctor.  Limit alcohol intake to no more than 1 drink per day for nonpregnant women and 2 drinks per day for men. One drink equals 12 oz of beer, 5 oz of wine, or 1 oz of hard liquor.  Manage stress. If you need help with this, ask your doctor.  Keep all follow-up visits as told by your doctor. This is important. Eating and drinking  Stay at a healthy weight.  Exercise regularly, as told by your doctor.  Drink enough fluid, especially when you: ? Exercise. ? Get sick. ? Are in hot temperatures.  Eat healthy foods,  such as: ? Low-fat (lean) proteins. ? Complex carbs (complex carbohydrates), such as whole wheat bread or brown rice. ? Fresh fruits and vegetables. ? Low-fat dairy products. ? Healthy fats.  Drink enough fluid to keep your pee (urine) clear or pale yellow. If you have diabetes:  Make sure you know the symptoms of hyperglycemia.  Follow your diabetes management plan, as told by your doctor. Make sure you: ? Take insulin and medicines as told. ? Follow your exercise plan. ? Follow your meal plan. Eat on time. Do not skip meals. ? Check your blood sugar as often as told. Make sure to check before and after exercise. If you exercise longer or in a different way than you normally do, check your blood sugar more often. ? Follow your sick day plan whenever you cannot eat or drink normally. Make this plan ahead of time with your doctor.  Share your diabetes management plan with people in your workplace, school, and household.  Check your urine for ketones when you are ill and as told by your doctor.  Carry a card or wear jewelry that says that you have diabetes. Contact a doctor if:  Your blood sugar level is higher than 240 mg/dL (16.1 mmol/L) for 2 days in a row.  You have problems keeping your blood sugar in your target range.  High blood sugar happens often for you. Get help right away if:  You have  trouble breathing.  You have a change in how you think, feel, or act (mental status).  You feel sick to your stomach (nauseous), and that feeling does not go away.  You cannot stop throwing up (vomiting). These symptoms may be an emergency. Do not wait to see if the symptoms will go away. Get medical help right away. Call your local emergency services (911 in the [place] Do not drive yourself to the hospital. Summary  Hyperglycemia is when the sugar (glucose) level in your blood is too high.  High blood sugar can happen to people who do or do not have diabetes.  Make sure you  drink enough fluids, eat healthy foods, and exercise regularly.  Contact your doctor if you have problems keeping your blood sugar in your target range. This information is not intended to replace advice given to you by your health care provider. Make sure you discuss any questions you have with your health care provider. Document Released: 04/27/2009 Document Revised: 03/17/2016 Document Reviewed: 03/17/2016 Elsevier Interactive Patient Education  2017 [employer].

## 2018-03-18 NOTE — Progress Notes (Signed)
Tara Gallagher , Mar 10, 1991, 27 y.o., female MRN: [id number] Patient Care Team    Relationship Specialty Notifications Start End  Ma Hillock, DO PCP - General Family Medicine  03/05/16   Sherlyn Hay, DO Consulting Physician Obstetrics and Gynecology  03/05/16     Chief Complaint  Patient presents with  . Headache    dry mouth ,was elevated yesterday 163     Subjective: Pt presents for an OV with complaints of headache, dry mouth, polyuria, polydipsia of few days duration.  Associated symptoms include patient reports she checked her fasting glucose yesterday and it was 163.  She states she had concerns she was becoming a diabetic because a friend of hers is a diabetic and those were their symptoms. Patient denies chest pain, shortness of breath, dizziness, fatigue, dysuria or lower extremity edema.  She denies any recent illness.  She endorses drinking plenty of water daily.  She reports she does not wear glasses, but knows she should.  Her headaches are located in bilateral temporal area.  Depression screen [facility] 2/9 02/26/2018 05/26/2017 11/06/2016 03/07/2016  Decreased Interest 1 0 0 3  Down, Depressed, Hopeless 0 0 0 3  PHQ - 2 Score 1 0 0 6  Altered sleeping 0 0 - 1  Tired, decreased energy 1 1 - 3  Change in appetite 2 1 - 1  Feeling bad or failure about yourself  0 0 - 3  Trouble concentrating 0 - - 1  Moving slowly or fidgety/restless 1 1 - 2  Suicidal thoughts 0 0 - 0  PHQ-9 Score 5 3 - 17  Difficult doing work/chores Somewhat difficult Somewhat difficult - -    No Known Allergies Social History   Tobacco Use  . Smoking status: Never Smoker  . Smokeless tobacco: Never Used  Substance Use Topics  . Alcohol use: No   Past Medical History:  Diagnosis Date  . Anemia affecting pregnancy   . Anxiety   . Breast mass 01/02/2016   Mammogram completed BI-RADS Category 3 probably benign short term interval follow-up suggested.  . Depression   . Ovarian cyst     . PONV (postoperative nausea and vomiting)   . Pyelonephritis affecting pregnancy in third trimester   . UTI (lower urinary tract infection) 3/15   Past Surgical History:  Procedure Laterality Date  . BREAST SURGERY     bilateral breast biopsies- benign  . DILATION AND CURETTAGE OF UTERUS    . HERNIA REPAIR    . INSERTION OF MESH N/A 02/01/2014   Procedure: INSERTION OF MESH;  Surgeon: Imogene Burn. Georgette Dover, MD;  Location: [facility];  Service: General;  Laterality: N/A;  . LAPAROSCOPIC VAGINAL HYSTERECTOMY WITH SALPINGECTOMY Bilateral 10/08/2017   Procedure: LAPAROSCOPIC ASSISTED VAGINAL HYSTERECTOMY WITH SALPINGECTOMY;  Surgeon: Paula Compton, MD;  Location: [facility];  Service: Gynecology;  Laterality: Bilateral;  OUT PATIENT IN BED  . PILONIDAL CYST EXCISION  2002 and 2014  . UMBILICAL HERNIA REPAIR N/A 02/01/2014   Procedure: HERNIA REPAIR UMBILICAL ADULT WITH MESH;  Surgeon: Imogene Burn. Georgette Dover, MD;  Location: [facility];  Service: General;  Laterality: N/A;  . WISDOM TOOTH EXTRACTION     Family History  Problem Relation Age of Onset  . Cancer Neg Hx   . Heart disease Neg Hx    Allergies as of 03/18/2018   No Known Allergies     Medication List        Accurate as of 03/18/18 10:05  AM. Always use your most recent med list.          venlafaxine XR 150 MG 24 hr capsule Commonly known as:  EFFEXOR-XR TAKE 1 CAPSULE EVERY DAY WITH BREAKFAST.       All past medical history, surgical history, allergies, family history, immunizations andmedications were updated in the EMR today and reviewed under the history and medication portions of their EMR.     ROS: Negative, with the exception of above mentioned in HPI   Objective:  BP 114/72 (BP Location: Right Arm, Patient Position: Sitting, Cuff Size: Large)   Pulse 63   Temp 98.2 F (36.8 C)   Resp 20   Ht '5\' 2"'$  (1.575 m)   Wt 203 lb 8 oz (92.3 kg)   LMP 10/01/2017   SpO2 100%   BMI 37.22 kg/m  Body mass index is  37.22 kg/m. Gen: Afebrile. No acute distress. Nontoxic in appearance, well developed, well nourished.  HENT: AT. [place].  MMM Eyes:Pupils Equal Round Reactive to light, Extraocular movements intact,  Conjunctiva without redness, discharge or icterus. CV: RRR no murmur, no edema Chest: CTAB, no wheeze or crackles. Good air movement, normal resp effort.  Abd: Soft.  Obese. NTND. BS present.  Skin: Warm well perfused.  Intact.  No rash.  No hyperpigmentation of neck. Neuro: Normal gait. PERLA. EOMi. Alert. Oriented x3  Psych: Normal affect, dress and demeanor. Normal speech. Normal thought content and judgment.  No exam data present No results found. Results for orders placed or performed in visit on 03/18/18 (from the past 24 hour(s))  POCT glucose (manual entry)     Status: Normal   Collection Time: 03/18/18 10:04 AM  Result Value Ref Range   POC Glucose 99 70 - 99 mg/dl    Assessment/Plan: Tara Gallagher is a 27 y.o. female present for OV for   Polydipsia/polyuria/elevated glucose/headache Blood glucose is 99 currently.  We will collect A1c and see met to rule out diabetes or electrolyte disturbance. - Avoid high sugar consumption - HgB A1c - Comp Met (CMET) - Urinalysis, Routine w reflex microscopic  Acute nonintractable headache, unspecified headache type -EnCourage her to hydrate.  Have her eyes examined by optometry/ophthalmology, they could be from eyestrain. -Encouraged her to start a headache diary and/or download a headache app to better track her headaches.  Avoiding any identifiable triggers. -Start magnesium and B12 over-the-counter. - f/y 4-8 weeks if headaches or symptoms to continue with headache logs.    Reviewed expectations re: course of current medical issues.  Discussed self-management of symptoms.  Outlined signs and symptoms indicating need for more acute intervention.  Patient verbalized understanding and all questions were answered.  Patient received an  After-Visit Summary.    Orders Placed This Encounter  Procedures  . POCT glucose (manual entry)   > 25 minutes spent with patient, >50% of time spent face to face counseling    Note is dictated utilizing voice recognition software. Although note has been proof read prior to signing, occasional typographical errors still can be missed. If any questions arise, please do not hesitate to call for verification.   electronically signed by:  Howard Pouch, DO  [place]

## 2018-03-19 LAB — URINALYSIS, ROUTINE W REFLEX MICROSCOPIC
BILIRUBIN URINE: NEGATIVE
Glucose, UA: NEGATIVE
HGB URINE DIPSTICK: NEGATIVE
KETONES UR: NEGATIVE
Leukocytes, UA: NEGATIVE
Nitrite: NEGATIVE
Protein, ur: NEGATIVE
SPECIFIC GRAVITY, URINE: 1.013 (ref 1.001–1.03)
pH: 6.5 (ref 5.0–8.0)

## 2018-03-22 ENCOUNTER — Encounter: Payer: Self-pay | Admitting: Family Medicine

## 2018-08-23 ENCOUNTER — Other Ambulatory Visit: Payer: Self-pay | Admitting: Family Medicine

## 2018-08-23 NOTE — Telephone Encounter (Signed)
Please inform patient the following information: Received request for med refill- she has an appt in 4 days--> please make sure she has enough to get her to her appt.

## 2018-08-23 NOTE — Telephone Encounter (Signed)
Left detailed message for patient to let us know if she needs refills prior to appointment.  Okay for PEC to discuss if patient calls back.

## 2018-08-27 ENCOUNTER — Ambulatory Visit (INDEPENDENT_AMBULATORY_CARE_PROVIDER_SITE_OTHER): Payer: Self-pay | Admitting: Family Medicine

## 2018-08-27 ENCOUNTER — Encounter: Payer: Self-pay | Admitting: Family Medicine

## 2018-08-27 VITALS — BP 115/71 | HR 70 | Temp 98.5°F | Resp 16 | Ht 64.0 in | Wt 205.4 lb

## 2018-08-27 DIAGNOSIS — R946 Abnormal results of thyroid function studies: Secondary | ICD-10-CM

## 2018-08-27 DIAGNOSIS — Z0000 Encounter for general adult medical examination without abnormal findings: Secondary | ICD-10-CM

## 2018-08-27 DIAGNOSIS — Z79899 Other long term (current) drug therapy: Secondary | ICD-10-CM

## 2018-08-27 DIAGNOSIS — E669 Obesity, unspecified: Secondary | ICD-10-CM

## 2018-08-27 DIAGNOSIS — Z131 Encounter for screening for diabetes mellitus: Secondary | ICD-10-CM

## 2018-08-27 DIAGNOSIS — R5382 Chronic fatigue, unspecified: Secondary | ICD-10-CM

## 2018-08-27 DIAGNOSIS — F418 Other specified anxiety disorders: Secondary | ICD-10-CM

## 2018-08-27 DIAGNOSIS — D649 Anemia, unspecified: Secondary | ICD-10-CM

## 2018-08-27 LAB — LIPID PANEL
CHOLESTEROL: 183 mg/dL (ref 0–200)
HDL: 59.3 mg/dL (ref >39.00)
LDL CALC: 101 mg/dL — AB (ref 0–99)
NonHDL: 123.78
Total CHOL/HDL Ratio: 3
Triglycerides: 113 mg/dL (ref 0.0–149.0)
VLDL: 22.6 mg/dL (ref 0.0–40.0)

## 2018-08-27 LAB — COMPREHENSIVE METABOLIC PANEL
ALBUMIN: 4.5 g/dL (ref 3.5–5.2)
ALT: 13 U/L (ref 0–35)
AST: 13 U/L (ref 0–37)
Alkaline Phosphatase: 67 U/L (ref 39–117)
BUN: 12 mg/dL (ref 6–23)
CHLORIDE: 101 meq/L (ref 96–112)
CO2: 29 mEq/L (ref 19–32)
Calcium: 9.3 mg/dL (ref 8.4–10.5)
Creatinine, Ser: 0.77 mg/dL (ref 0.40–1.20)
GFR: 89.01 mL/min (ref >60.00)
GLUCOSE: 91 mg/dL (ref 70–99)
POTASSIUM: 4.5 meq/L (ref 3.5–5.1)
SODIUM: 137 meq/L (ref 135–145)
Total Bilirubin: 0.3 mg/dL (ref 0.2–1.2)
Total Protein: 7.2 g/dL (ref 6.0–8.3)

## 2018-08-27 LAB — CBC
HEMATOCRIT: 40.9 % (ref 36.0–46.0)
Hemoglobin: 13.6 g/dL (ref 12.0–15.0)
MCHC: 33.3 g/dL (ref 30.0–36.0)
MCV: 89.4 fl (ref 78.0–100.0)
Platelets: 250 10*3/uL (ref 150.0–400.0)
RBC: 4.57 Mil/uL (ref 3.87–5.11)
RDW: 13.5 % (ref 11.5–15.5)
WBC: 5.8 10*3/uL (ref 4.0–10.5)

## 2018-08-27 LAB — TSH: TSH: 2.06 u[IU]/mL (ref 0.35–4.50)

## 2018-08-27 LAB — HEMOGLOBIN A1C: Hgb A1c MFr Bld: 5.4 % (ref 4.6–6.5)

## 2018-08-27 MED ORDER — VENLAFAXINE HCL ER 150 MG PO CP24: ORAL_CAPSULE | ORAL | 5 refills | Status: DC

## 2018-08-27 NOTE — Patient Instructions (Addendum)
Great to see you today.  Follow up in 6 months.  Try adding: Vit d  600- 800 u daily. Multivitamin and B12 1000 mcg (100 mg) daily.  We will call you with labs.  Happy Valentines!  Health Maintenance, Female Adopting a healthy lifestyle and getting preventive care can go a long way to promote health and wellness. Talk with your health care provider about what schedule of regular examinations is right for you. This is a good chance for you to check in with your provider about disease prevention and staying healthy. In between checkups, there are plenty of things you can do on your own. Experts have done a lot of research about which lifestyle changes and preventive measures are most likely to keep you healthy. Ask your health care provider for more information. Weight and diet Eat a healthy diet  Be sure to include plenty of vegetables, fruits, low-fat dairy products, and lean protein.  Do not eat a lot of foods high in solid fats, added sugars, or salt.  Get regular exercise. This is one of the most important things you can do for your health. ? Most adults should exercise for at least 150 minutes each week. The exercise should increase your heart rate and make you sweat (moderate-intensity exercise). ? Most adults should also do strengthening exercises at least twice a week. This is in addition to the moderate-intensity exercise. Maintain a healthy weight  Body mass index (BMI) is a measurement that can be used to identify possible weight problems. It estimates body fat based on height and weight. Your health care provider can help determine your BMI and help you achieve or maintain a healthy weight.  For females 66 years of age and older: ? A BMI below 18.5 is considered underweight. ? A BMI of 18.5 to 24.9 is normal. ? A BMI of 25 to 29.9 is considered overweight. ? A BMI of 30 and above is considered obese. Watch levels of cholesterol and blood lipids  You should start having your  blood tested for lipids and cholesterol at 29 years of age, then have this test every 5 years.  You may need to have your cholesterol levels checked more often if: ? Your lipid or cholesterol levels are high. ? You are older than 29 years of age. ? You are at high risk for heart disease. Cancer screening Lung Cancer  Lung cancer screening is recommended for adults 95-49 years old who are at high risk for lung cancer because of a history of smoking.  A yearly low-dose CT scan of the lungs is recommended for people who: ? Currently smoke. ? Have quit within the past 15 years. ? Have at least a 30-pack-year history of smoking. A pack year is smoking an average of one pack of cigarettes a day for 1 year.  Yearly screening should continue until it has been 15 years since you quit.  Yearly screening should stop if you develop a health problem that would prevent you from having lung cancer treatment. Breast Cancer  Practice breast self-awareness. This means understanding how your breasts normally appear and feel.  It also means doing regular breast self-exams. Let your health care provider know about any changes, no matter how small.  If you are in your 20s or 30s, you should have a clinical breast exam (CBE) by a health care provider every 1-3 years as part of a regular health exam.  If you are 22 or older, have a CBE every  year. Also consider having a breast X-ray (mammogram) every year.  If you have a family history of breast cancer, talk to your health care provider about genetic screening.  If you are at high risk for breast cancer, talk to your health care provider about having an MRI and a mammogram every year.  Breast cancer gene (BRCA) assessment is recommended for women who have family members with BRCA-related cancers. BRCA-related cancers include: ? Breast. ? Ovarian. ? Tubal. ? Peritoneal cancers.  Results of the assessment will determine the need for genetic counseling  and BRCA1 and BRCA2 testing. Cervical Cancer Your health care provider may recommend that you be screened regularly for cancer of the pelvic organs (ovaries, uterus, and vagina). This screening involves a pelvic examination, including checking for microscopic changes to the surface of your cervix (Pap test). You may be encouraged to have this screening done every 3 years, beginning at age 41.  For women ages 70-65, health care providers may recommend pelvic exams and Pap testing every 3 years, or they may recommend the Pap and pelvic exam, combined with testing for human papilloma virus (HPV), every 5 years. Some types of HPV increase your risk of cervical cancer. Testing for HPV may also be done on women of any age with unclear Pap test results.  Other health care providers may not recommend any screening for nonpregnant women who are considered low risk for pelvic cancer and who do not have symptoms. Ask your health care provider if a screening pelvic exam is right for you.  If you have had past treatment for cervical cancer or a condition that could lead to cancer, you need Pap tests and screening for cancer for at least 20 years after your treatment. If Pap tests have been discontinued, your risk factors (such as having a new sexual partner) need to be reassessed to determine if screening should resume. Some women have medical problems that increase the chance of getting cervical cancer. In these cases, your health care provider may recommend more frequent screening and Pap tests. Colorectal Cancer  This type of cancer can be detected and often prevented.  Routine colorectal cancer screening usually begins at 29 years of age and continues through 29 years of age.  Your health care provider may recommend screening at an earlier age if you have risk factors for colon cancer.  Your health care provider may also recommend using home test kits to check for hidden blood in the stool.  A small camera  at the end of a tube can be used to examine your colon directly (sigmoidoscopy or colonoscopy). This is done to check for the earliest forms of colorectal cancer.  Routine screening usually begins at age 56.  Direct examination of the colon should be repeated every 5-10 years through 29 years of age. However, you may need to be screened more often if early forms of precancerous polyps or small growths are found. Skin Cancer  Check your skin from head to toe regularly.  Tell your health care provider about any new moles or changes in moles, especially if there is a change in a mole's shape or color.  Also tell your health care provider if you have a mole that is larger than the size of a pencil eraser.  Always use sunscreen. Apply sunscreen liberally and repeatedly throughout the day.  Protect yourself by wearing long sleeves, pants, a wide-brimmed hat, and sunglasses whenever you are outside. Heart disease, diabetes, and high blood pressure  High blood pressure causes heart disease and increases the risk of stroke. High blood pressure is more likely to develop in: ? People who have blood pressure in the high end of the normal range (130-139/85-89 mm Hg). ? People who are overweight or obese. ? People who are African American.  If you are 40-90 years of age, have your blood pressure checked every 3-5 years. If you are 109 years of age or older, have your blood pressure checked every year. You should have your blood pressure measured twice-once when you are at a hospital or clinic, and once when you are not at a hospital or clinic. Record the average of the two measurements. To check your blood pressure when you are not at a hospital or clinic, you can use: ? An automated blood pressure machine at a pharmacy. ? A home blood pressure monitor.  If you are between 108 years and 44 years old, ask your health care provider if you should take aspirin to prevent strokes.  Have regular diabetes  screenings. This involves taking a blood sample to check your fasting blood sugar level. ? If you are at a normal weight and have a low risk for diabetes, have this test once every three years after 29 years of age. ? If you are overweight and have a high risk for diabetes, consider being tested at a younger age or more often. Preventing infection Hepatitis B  If you have a higher risk for hepatitis B, you should be screened for this virus. You are considered at high risk for hepatitis B if: ? You were born in a country where hepatitis B is common. Ask your health care provider which countries are considered high risk. ? Your parents were born in a high-risk country, and you have not been immunized against hepatitis B (hepatitis B vaccine). ? You have HIV or AIDS. ? You use needles to inject street drugs. ? You live with someone who has hepatitis B. ? You have had sex with someone who has hepatitis B. ? You get hemodialysis treatment. ? You take certain medicines for conditions, including cancer, organ transplantation, and autoimmune conditions. Hepatitis C  Blood testing is recommended for: ? Everyone born from [birth year] through 1966. ? Anyone with known risk factors for hepatitis C. Sexually transmitted infections (STIs)  You should be screened for sexually transmitted infections (STIs) including gonorrhea and chlamydia if: ? You are sexually active and are younger than 29 years of age. ? You are older than 29 years of age and your health care provider tells you that you are at risk for this type of infection. ? Your sexual activity has changed since you were last screened and you are at an increased risk for chlamydia or gonorrhea. Ask your health care provider if you are at risk.  If you do not have HIV, but are at risk, it may be recommended that you take a prescription medicine daily to prevent HIV infection. This is called pre-exposure prophylaxis (PrEP). You are considered at risk  if: ? You are sexually active and do not regularly use condoms or know the HIV status of your partner(s). ? You take drugs by injection. ? You are sexually active with a partner who has HIV. Talk with your health care provider about whether you are at high risk of being infected with HIV. If you choose to begin PrEP, you should first be tested for HIV. You should then be tested every 3 months for as long  as you are taking PrEP. Pregnancy  If you are premenopausal and you may become pregnant, ask your health care provider about preconception counseling.  If you may become pregnant, take 400 to 800 micrograms (mcg) of folic acid every day.  If you want to prevent pregnancy, talk to your health care provider about birth control (contraception). Osteoporosis and menopause  Osteoporosis is a disease in which the bones lose minerals and strength with aging. This can result in serious bone fractures. Your risk for osteoporosis can be identified using a bone density scan.  If you are 75 years of age or older, or if you are at risk for osteoporosis and fractures, ask your health care provider if you should be screened.  Ask your health care provider whether you should take a calcium or vitamin D supplement to lower your risk for osteoporosis.  Menopause may have certain physical symptoms and risks.  Hormone replacement therapy may reduce some of these symptoms and risks. Talk to your health care provider about whether hormone replacement therapy is right for you. Follow these instructions at home:  Schedule regular health, dental, and eye exams.  Stay current with your immunizations.  Do not use any tobacco products including cigarettes, chewing tobacco, or electronic cigarettes.  If you are pregnant, do not drink alcohol.  If you are breastfeeding, limit how much and how often you drink alcohol.  Limit alcohol intake to no more than 1 drink per day for nonpregnant women. One drink equals  12 ounces of beer, 5 ounces of wine, or 1 ounces of hard liquor.  Do not use street drugs.  Do not share needles.  Ask your health care provider for help if you need support or information about quitting drugs.  Tell your health care provider if you often feel depressed.  Tell your health care provider if you have ever been abused or do not feel safe at home. This information is not intended to replace advice given to you by your health care provider. Make sure you discuss any questions you have with your health care provider. Document Released: 01/13/2011 Document Revised: 12/06/2015 Document Reviewed: 04/03/2015 [employer] Interactive Patient Education  2019 [employer].
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
## 2018-08-28 LAB — IRON,TIBC AND FERRITIN PANEL
%SAT: 34 % (ref 16–45)
Ferritin: 28 ng/mL (ref 16–154)
Iron: 119 ug/dL (ref 40–190)
TIBC: 354 mcg/dL (calc) (ref 250–450)

## 2018-10-20 ENCOUNTER — Other Ambulatory Visit: Payer: Self-pay | Admitting: Family Medicine

## 2019-01-26 ENCOUNTER — Other Ambulatory Visit: Payer: Self-pay

## 2019-01-26 MED ORDER — VENLAFAXINE HCL ER 150 MG PO CP24: ORAL_CAPSULE | ORAL | 0 refills | Status: DC

## 2019-01-26 NOTE — Progress Notes (Signed)
Pt called and stated she needed refill on Effexor. Pt has F/U appt scheduled for 6 month F/U.

## 2019-02-23 ENCOUNTER — Ambulatory Visit: Payer: Self-pay | Admitting: Family Medicine

## 2019-02-23 ENCOUNTER — Telehealth: Payer: Self-pay | Admitting: Family Medicine

## 2019-02-23 MED ORDER — VENLAFAXINE HCL ER 150 MG PO CP24: ORAL_CAPSULE | ORAL | 0 refills | Status: DC

## 2019-02-23 NOTE — Telephone Encounter (Signed)
Copied from [place] [id number]. Topic: Appointment Scheduling - Scheduling Inquiry for Clinic >> Feb 23, 2019 10:09 AM Alanda Slim E wrote: Reason for CRM: Pt returned call to reschedule appt / office not available after several attempts/ please advise   Called patient Rescheduled patient to Dr. Lucita Lora first 'open' appointment which was 8/24.  Patient will be out of meds today.She cannot go without meds or stop them She needs enough until visit on 8/24 or 1 month supply [facility] [place].  venlafaxine XR (EFFEXOR-XR) 150 MG 24 hr capsule [[id number]]

## 2019-02-23 NOTE — Telephone Encounter (Signed)
Pt had appt today for Dr Raoul Pitch, Dr Raoul Pitch is out of the office and appt had to be cancelled. Pts was sent in a 2 week supply to last until next appt. Appt scheduled for 03/07/2019. Pt had CPE in 08/2018.

## 2019-03-07 ENCOUNTER — Ambulatory Visit (INDEPENDENT_AMBULATORY_CARE_PROVIDER_SITE_OTHER): Payer: Self-pay | Admitting: Family Medicine

## 2019-03-07 ENCOUNTER — Encounter: Payer: Self-pay | Admitting: Family Medicine

## 2019-03-07 ENCOUNTER — Other Ambulatory Visit: Payer: Self-pay

## 2019-03-07 VITALS — Temp 97.5°F | Wt 201.0 lb

## 2019-03-07 DIAGNOSIS — F418 Other specified anxiety disorders: Secondary | ICD-10-CM

## 2019-03-07 MED ORDER — VENLAFAXINE HCL ER 150 MG PO CP24: ORAL_CAPSULE | ORAL | 1 refills | Status: DC
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
## 2019-09-09 ENCOUNTER — Other Ambulatory Visit: Payer: Self-pay | Admitting: Family Medicine

## 2019-09-09 NOTE — Telephone Encounter (Signed)
Pt was called and told she needed to schedule OV for 6 month [facility]. She was scheduled and stated she had three days worth of medication .

## 2019-09-09 NOTE — Telephone Encounter (Signed)
Patient refill request  venlafaxine XR (EFFEXOR-XR) 150 MG 24 hr capsule [[id number]   [id number] [place], [place]

## 2019-09-11 ENCOUNTER — Other Ambulatory Visit: Payer: Self-pay | Admitting: Family Medicine

## 2019-09-12 ENCOUNTER — Ambulatory Visit (INDEPENDENT_AMBULATORY_CARE_PROVIDER_SITE_OTHER): Payer: Managed Care, Other (non HMO) | Admitting: Family Medicine

## 2019-09-12 ENCOUNTER — Other Ambulatory Visit: Payer: Self-pay

## 2019-09-12 ENCOUNTER — Encounter: Payer: Self-pay | Admitting: Family Medicine

## 2019-09-12 VITALS — BP 124/76 | HR 64 | Temp 98.7°F | Resp 18 | Ht 63.0 in | Wt 201.0 lb

## 2019-09-12 DIAGNOSIS — F418 Other specified anxiety disorders: Secondary | ICD-10-CM

## 2019-09-12 DIAGNOSIS — G479 Sleep disorder, unspecified: Secondary | ICD-10-CM | POA: Diagnosis not present

## 2019-09-12 MED ORDER — TRAZODONE HCL 50 MG PO TABS: 25.0000 mg | ORAL_TABLET | Freq: Every day | ORAL | 0 refills | Status: AC

## 2019-09-12 MED ORDER — VENLAFAXINE HCL ER 150 MG PO CP24: ORAL_CAPSULE | ORAL | 1 refills | Status: AC

## 2019-09-12 NOTE — Progress Notes (Signed)
VIRTUAL VISIT VIA VIDEO  I connected with Tara Gallagher on 09/12/19 at  2:30 PM EST by a video enabled telemedicine application and verified that I am speaking with the correct person using two identifiers. Location patient: Home Location provider: [facility], Office Persons participating in the virtual visit: Patient, Dr. Claiborne Billings and R.Baker, LPN  I discussed the limitations of evaluation and management by telemedicine and the availability of in person appointments. The patient expressed understanding and agreed to proceed.   SUBJECTIVE Chief Complaint  Patient presents with  . Depression    Pt needs refills on medications   . Anxiety    HPI: Tara Gallagher is a 29 y.o. female present for [facility] Depression with anxiety:  Pt reports she did get promoted to a new job with more responsibility. She states she has "her days." She report she took in a friend into their home to help her out. She states that was stressful and did not work out well. She is feeling more overwhelmed than prior.  She also is not sleeping well.  She states over the last month she has been waking up a few times in the middle the night and not able to get back to sleep for greater than an hour.  She is tired all the time. Prior meds: lexapro.    ROS: See pertinent positives and negatives per HPI. Depression screen [facility] 2/9 09/12/2019 03/07/2019 08/27/2018 02/26/2018 05/26/2017  Decreased Interest 1 0 1 1 0  Down, Depressed, Hopeless 0 0 1 0 0  PHQ - 2 Score 1 0 2 1 0  Altered sleeping 3 3 2  0 0  Tired, decreased energy 3 3 3 1 1   Change in appetite 0 0 0 2 1  Feeling bad or failure about yourself  0 0 0 0 0  Trouble concentrating 1 0 0 0 -  Moving slowly or fidgety/restless 0 0 0 1 1  Suicidal thoughts 0 0 0 0 0  PHQ-9 Score 8 6 7 5 3   Difficult doing work/chores Somewhat difficult - Not difficult at all Somewhat difficult Somewhat difficult   GAD 7 : Generalized Anxiety Score 09/12/2019 03/07/2019  08/27/2018 05/26/2017  Nervous, Anxious, on Edge 2 0 1 2  Control/stop worrying 2 0 0 1  Worry too much - different things 0 1 1 1   Trouble relaxing 2 0 0 2  Restless 2 0 0 0  Easily annoyed or irritable 3 1 1 1   Afraid - awful might happen 0 0 0 0  Total GAD 7 Score 11 2 3 7   Anxiety Difficulty Somewhat difficult Somewhat difficult Not difficult at all Somewhat difficult   Patient Active Problem List   Diagnosis Date Noted  . Anemia 08/27/2018  . Depression with anxiety 03/07/2016  . Abnormal thyroid exam 03/07/2016  . Chronic fatigue 03/07/2016  . Obesity (BMI 30.0-34.9) 03/07/2016    Social History   Tobacco Use  . Smoking status: Never Smoker  . Smokeless tobacco: Never Used  Substance Use Topics  . Alcohol use: No    Current Outpatient Medications:  .  venlafaxine XR (EFFEXOR-XR) 150 MG 24 hr capsule, Take one tablet daily with breakfast, Disp: 90 capsule, Rfl: 1  No Known Allergies  OBJECTIVE: BP 124/76   Pulse 64   Temp 98.7 F (37.1 C) (Oral)   Resp 18   Ht 5\' 3"  (1.6 m)   Wt 201 lb (91.2 kg)   LMP 10/01/2017   BMI 35.61  kg/m  Gen: Afebrile. No acute distress.  HENT: AT. [place].  Eyes:Pupils Equal Round Reactive to light, Extraocular movements intact,  Conjunctiva without redness, discharge or icterus. Neuro:  Normal gait. PERLA. EOMi. Alert. Oriented.Marland Kitchen Psych: Normal affect, dress and demeanor. Normal speech. Normal thought content and judgment   ASSESSMENT AND PLAN: Tara Gallagher is a 29 y.o. female present for  Depression with anxiety/sleep disturbance -Not as well controlled with new onset sleep disturbance.  Discussed options with her today. -Start trazodone 25-75 mg nightly.  Taper instructions provided to her today.  Maximum 75 mg nightly prior to being seen. -Continue Effexor 150 mg  - Follow-up in 4 weeks, then return to every 99-month follow-up.  No orders of the defined types were placed in this encounter.  Meds ordered this encounter    Medications  . venlafaxine XR (EFFEXOR-XR) 150 MG 24 hr capsule    Sig: Take one tablet daily with breakfast    Dispense:  90 capsule    Refill:  1  . traZODone (DESYREL) 50 MG tablet    Sig: Take 0.5-1.5 tablets (25-75 mg total) by mouth at bedtime.    Dispense:  45 tablet    Refill:  [place], DO 09/12/2019

## 2019-10-04 ENCOUNTER — Other Ambulatory Visit: Payer: Self-pay | Admitting: Family Medicine

## 2019-11-11 ENCOUNTER — Other Ambulatory Visit: Payer: Self-pay | Admitting: Family Medicine

## 2019-12-04 ENCOUNTER — Other Ambulatory Visit: Payer: Self-pay | Admitting: Family Medicine
